# Patient Record
Sex: Male | Born: 1960 | Race: Black or African American | Hispanic: No | Marital: Married | Smoking: Never smoker
Health system: Southern US, Community
[De-identification: ages and names within clinical notes are randomized; demographics above are authoritative.]

## PROBLEM LIST (undated history)

## (undated) DIAGNOSIS — I10 Essential (primary) hypertension: Secondary | ICD-10-CM

## (undated) DIAGNOSIS — I509 Heart failure, unspecified: Secondary | ICD-10-CM

## (undated) DIAGNOSIS — Z6841 Body Mass Index (BMI) 40.0 and over, adult: Secondary | ICD-10-CM

## (undated) DIAGNOSIS — I2510 Atherosclerotic heart disease of native coronary artery without angina pectoris: Secondary | ICD-10-CM

## (undated) DIAGNOSIS — G250 Essential tremor: Secondary | ICD-10-CM

## (undated) DIAGNOSIS — G4733 Obstructive sleep apnea (adult) (pediatric): Secondary | ICD-10-CM

## (undated) DIAGNOSIS — E785 Hyperlipidemia, unspecified: Secondary | ICD-10-CM

## (undated) DIAGNOSIS — Z951 Presence of aortocoronary bypass graft: Secondary | ICD-10-CM

## (undated) DIAGNOSIS — I219 Acute myocardial infarction, unspecified: Secondary | ICD-10-CM

## (undated) DIAGNOSIS — R51 Headache: Secondary | ICD-10-CM

## (undated) DIAGNOSIS — F329 Major depressive disorder, single episode, unspecified: Secondary | ICD-10-CM

## (undated) DIAGNOSIS — F419 Anxiety disorder, unspecified: Secondary | ICD-10-CM

## (undated) HISTORY — DX: Obstructive sleep apnea (adult) (pediatric): G47.33

## (undated) HISTORY — DX: Anxiety disorder, unspecified: F41.9

## (undated) HISTORY — DX: Heart failure, unspecified: I50.9

## (undated) HISTORY — PX: OTHER SURGICAL HISTORY: SHX169

## (undated) HISTORY — DX: Headache: R51

## (undated) HISTORY — DX: Essential tremor: G25.0

## (undated) HISTORY — DX: Hyperlipidemia, unspecified: E78.5

## (undated) HISTORY — DX: Body Mass Index (BMI) 40.0 and over, adult: Z684

## (undated) HISTORY — DX: Morbid (severe) obesity due to excess calories: E66.01

## (undated) HISTORY — DX: Major depressive disorder, single episode, unspecified: F32.9

---

## 1999-10-22 ENCOUNTER — Emergency Department (HOSPITAL_COMMUNITY): Admission: EM | Admit: 1999-10-22 | Discharge: 1999-10-22 | Payer: Self-pay | Admitting: Emergency Medicine

## 2001-05-08 ENCOUNTER — Encounter: Payer: Self-pay | Admitting: Emergency Medicine

## 2001-05-08 ENCOUNTER — Inpatient Hospital Stay (HOSPITAL_COMMUNITY): Admission: EM | Admit: 2001-05-08 | Discharge: 2001-05-10 | Payer: Self-pay | Admitting: Emergency Medicine

## 2003-02-12 ENCOUNTER — Ambulatory Visit (HOSPITAL_COMMUNITY): Admission: RE | Admit: 2003-02-12 | Discharge: 2003-02-12 | Payer: Self-pay | Admitting: Gastroenterology

## 2005-09-13 ENCOUNTER — Emergency Department (HOSPITAL_COMMUNITY): Admission: EM | Admit: 2005-09-13 | Discharge: 2005-09-13 | Payer: Self-pay | Admitting: Family Medicine

## 2005-11-30 ENCOUNTER — Emergency Department (HOSPITAL_COMMUNITY): Admission: EM | Admit: 2005-11-30 | Discharge: 2005-11-30 | Payer: Self-pay | Admitting: Emergency Medicine

## 2005-12-01 ENCOUNTER — Ambulatory Visit (HOSPITAL_COMMUNITY): Admission: AD | Admit: 2005-12-01 | Discharge: 2005-12-01 | Payer: Self-pay | Admitting: Urology

## 2005-12-06 ENCOUNTER — Emergency Department (HOSPITAL_COMMUNITY): Admission: EM | Admit: 2005-12-06 | Discharge: 2005-12-06 | Payer: Self-pay | Admitting: Emergency Medicine

## 2006-06-29 HISTORY — PX: SHOULDER SURGERY: SHX246

## 2007-07-31 ENCOUNTER — Emergency Department (HOSPITAL_COMMUNITY): Admission: EM | Admit: 2007-07-31 | Discharge: 2007-07-31 | Payer: Self-pay | Admitting: Emergency Medicine

## 2010-11-14 NOTE — Op Note (Signed)
NAMESAVION, Trevor Jackson             ACCOUNT NO.:  [id number]   MEDICAL RECORD NO.:  [id number]          PATIENT TYPE:  AMB   LOCATION:  DAY                          FACILITY:  [facility]   PHYSICIAN:  Boston Service, M.D.DATE OF BIRTH:  07/12/1960   DATE OF PROCEDURE:  12/01/2005  DATE OF DISCHARGE:                                 OPERATIVE REPORT   PREOPERATIVE DIAGNOSIS:  6 x 6 mm right ureterovesical junction stone, 50-  year-old black male diabetic.   POSTOPERATIVE DIAGNOSIS:  6 x 6 mm right ureterovesical junction stone, 66-  year-old black male diabetic.   PROCEDURE:  Cystoscopy, retrograde ureteroscopy and stone manipulation.   SURGEON:  Boston Service, M.D.   ASSISTANT:  None.   ANESTHESIA:  General.   SPECIMEN:  6 x 6 mm stone.   ESTIMATED BLOOD LOSS:  Minimal.   COMPLICATIONS:  None.   DESCRIPTION OF PROCEDURE:  The patient was prepped and draped in the  dorsolithotomy position after institution of adequate level of general  anesthesia.  A well lubricated 21-French panendoscope was gently inserted at  the urethral meatus.  Normal urethra and sphincter, nonobstructive prostate.  Careful inspection of the bladder with the 12 and 70 degrees lenses showed  no obvious intravesical pathology.   Blocking catheter was selected, positioned at the left ureteral orifice.  Retrograde films were obtained with injection of 3-6 mL of contrast. Ureter,  pelvis and calyces were outlined without evidence of filling defect or  obstruction.  Similar technique was used on the right side. Tightly impacted  6 mm calculus noted within the distal ureter as a 6 mm filling defect with  proximal hydronephrosis.  No other obvious stones on the right side.  Guidewire was negotiated beyond the stone.  End-hole catheter passed over  the guide wire, left in place for about 5 minutes and then withdrawn.  There  was immediate reflux of concentrated urine from the right ureteral orifice.  The  short 6-French ureteroscope was then inserted alongside the guidewire.  Stone was encountered about 2 or 3 cm from the ureteral orifice.  Distal  ureter appeared well dilated at this point.  Stone was negotiated into a  Careers information officer and then gently withdrawn.  Stone was placed in a  sterile container.  Bard basket was removed.  Ureteroscope was reinserted to  the limit of the short 6-French scope.  There was no evidence of ureteral  injury.  There was mild erythema in the area where the stone had been  impacted, but no other obvious calculi within the ureter.  Ureteroscope was then withdrawn.  Cystoscope was reinserted.  Bladder was  drained.  The guidewire was removed on the right side.  The patient was  given a B&O suppository.  Cystoscope was then removed and the patient was  returned to recovery room in satisfactory condition.           ______________________________  Boston Service, M.D.     RH/MEDQ  D:  12/01/2005  T:  12/02/2005  Job:  [id number]   cc:   Olene Craven, M.D.  Fax:  [phone] 

## 2010-11-14 NOTE — Discharge Summary (Signed)
[facility]  Patient:    Trevor Jackson, Trevor Jackson Visit Number: [id number] MRN: [id number]          Service Type: MED Location: 3W [phone] Attending Physician:  Corwin Levins Dictated by:   Corwin Levins, M.D. [facility] Admit Date:  05/08/2001 Discharge Date: 05/10/2001   CC:         Evette Georges, M.D. [facility]   Discharge Summary  DISCHARGE DIAGNOSES: 1. Chest pain, probably musculoskeletal. 2. Diabetes mellitus. 3. Hypertension. 4. Morbid obesity.  CONSULTATIONS:  None.  PROCEDURE:  Two-dimensional echocardiogram revealing overall left ventricular systolic function normal.  Left ventricular ejection fraction estimated range 55-65%, no left ventricular regional wall motion abnormalities.  No evidence of pericarditis.  HISTORY OF PRESENT ILLNESS:  Please see History and Physical dictated on day of admission.  HOSPITAL COURSE:  Trevor Jackson is a very nice, 50 year old, African-American male with extremely strong family history of heart disease who presented with left upper chest discomfort.  It was pleuritic in nature, somewhat dull, but overall atypical with a differential at that point including pleurisy, pericarditis, myocardial ischemia, chest wall pain or GI.  He was admitted and telemetry applied.  Sinus rhythm was noted throughout.  Cardiac enzymes were negative.  A 2D echocardiogram as above.  Tylenol No. 3 was used for pain control which worked very well.  He was also at one point given Protonix 40 mg, but this did on effect the discomfort.  Chest x-ray was negative with no EKG signs noted.  Also of note was a hemoglobin A1C determined after elevated blood sugar on admission of 10.0 indicating at least mildly under control diabetes mellitus.  He was begun on Actos 30 mg prior to discharge which he tolerated well and this will be increased to 45 on discharge.  He was also started on Altace for elevated blood pressure to which he tolerated as well  and is to be increased to 5 mg to 10 mg on discharge.  DISPOSITION:  Discharged to home in good condition.  ACTIVITY:  No restrictions.  DIET:  Follow an 1800 calorie ADA diet.  FOLLOWUP:  Follow up with diabetic education coming up Monday with his first class.  He is to follow up with Dr. Tawanna Cooler in one to two weeks.  DISCHARGE MEDICATIONS: 1. Ecotrin 325 mg p.o. q.d. 2. Altace 10 mg p.o. q.d. 3. Actos 45 mg p.o. q.d. 4. Tylenol No. 3 one q.i.d. p.r.n., #30 with no refill. Dictated by:   Corwin Levins, M.D. [facility] Attending Physician:  Corwin Levins DD:  05/10/01 TD:  05/10/01 Job: [id number]

## 2010-11-14 NOTE — H&P (Signed)
[facility]  Patient:    Trevor Jackson, Trevor Jackson Visit Number: [id number] MRN: [id number]          Service Type: MED Location: 3W [phone] Attending Physician:  Evette Georges Dictated by:   Corwin Levins, M.D. [facility] Admit Date:  05/08/2001   CC:         Evette Georges, M.D. [facility]   History and Physical  CHIEF COMPLAINT:  Chest pain off and on since last p.m.  HISTORY OF PRESENT ILLNESS:  Trevor Jackson is a 50 year old black male with extremely strong family history of heart disease who is here with left chest discomfort off and on since last p.m., dull without radiation, shortness of breath, nausea, vomiting or diaphoresis.  It was questionably better with two sublingual nitroglycerins and O2 on entrance to the ER.  It is pleuritic, worse with deep breaths to the left chest but not with movement of the left shoulder.  Cardiac risk factors are negative for hypertension, diabetes and tobacco.  There is an unknown cholesterol status.  He does have a strong family history in early mid-40s of heart disease and MI with morbidity as well as morbid obesity.  He denies pain at the moment.  PAST MEDICAL HISTORY:  Illnesses:  Morbid obesity.  PAST SURGICAL HISTORY:  None.  ALLERGIES:  None.  MEDICATIONS:  None.  SOCIAL HISTORY:  Tobacco:  Never.  Alcohol:  None.  No illicit drugs. Married.  Two children.  Lives in [place] with the family.  FAMILY HISTORY:  Father deceased at 39 with heart attack.  Sister deceased at 76 with MI.  One brother deceased with colon and pancreas cancer at 84.  One sister at 43 with heart disease.  Another sister with MI, status post CABG at 18.  REVIEW OF SYSTEMS:  Noncontributory except for "feeling warm" after being here in the emergency room.  PHYSICAL EXAMINATION:  GENERAL:  Trevor Jackson is a very nice 50 year old black male, alert and oriented x 3.  VITAL SIGNS:  Blood pressure 147/105, pulse 81, respiratory rate  20, temperature 98.5.  EENT:   Sclerae clear.  TMs clear.  Pharynx benign.  NECK:  Without lymphadenopathy, JVD, thyromegaly.  CHEST:  No rales or wheezing.  CARDIAC:  Regular rate and rhythm.  There is no chest wall tenderness.  ABDOMEN:  Soft, nontender.  Positive bowel sounds.  No organomegaly.  No masses.  EXTREMITIES:  No edema.  NEUROLOGIC:  Cranial nerves II-XII intact, otherwise, nonfocal.  SKIN:  No rash or significant lesion.  LABORATORY DATA:  ECG:  Sinus rhythm with a questionable diffuse mild ST elevation versus early repolarization.  Chest x-ray pending.  CPK-MB ______ was 1.3.  Troponin I 0.02.  CMET normal except for sodium slightly low at 134, glucose 256.  LFTs otherwise within normal limits.  White blood cell count 6.7, hemoglobin 14.6.  ASSESSMENT AND PLAN: 1. Chest pain, atypical.  Differential includes pleurisy, pericarditis,    myocardial ischemia, chest wall or gastrointestinal.  He is to be admitted,    apply telemetry, rule out myocardial infarction with serial cardiac    enzymes, check 2-D echocardiogram, O2 at 2 L, pain medications, Protonix    40 mg, followup chest x-ray and consider outpatient stress testing should    he rule out. 2. Hyperglycemia, apparently new diagnosis.  Unclear whether this relates to    the low-grade temperature and acute mild illness such as viral.  Will check    hemoglobin A1c to  assess overall control the last several months.  Check    capillary blood glucoses and apply sliding-scale insulin. Dictated by:   Corwin Levins, M.D. [facility] Attending Physician:  Evette Georges DD:  05/08/01 TD:  05/09/01 Job: [id number]

## 2010-11-14 NOTE — Op Note (Signed)
Trevor Jackson, Trevor Jackson                         ACCOUNT NO.:  [id number]   MEDICAL RECORD NO.:  [id number]                   PATIENT TYPE:  AMB   LOCATION:  ENDO                                 FACILITY:  [facility]   PHYSICIAN:  Anselmo Rod, M.D.               DATE OF BIRTH:  02/18/1961   DATE OF PROCEDURE:  02/12/2003  DATE OF DISCHARGE:                                 OPERATIVE REPORT   PROCEDURE PERFORMED:  Screening colonoscopy.   ENDOSCOPIST:  Charna Elizabeth, M.D.   INSTRUMENT USED:  Olympus video colonoscope.   INDICATIONS FOR PROCEDURE:  The patient is a 50 year old African-American  male with a family history of colon cancer in his brother, who died of it at  84.  Rule out colonic polyps, masses, etc.   PREPROCEDURE PREPARATION:  Informed consent was procured from the patient.  The patient was fasted for eight hours prior to the procedure and prepped  with a bottle of magnesium citrate and a gallon of GoLYTELY the night prior  to the procedure.   PREPROCEDURE PHYSICAL:  The patient had stable vital signs.  Neck supple.  Chest clear to auscultation.  S1 and S2 regular.  Abdomen soft with normal  bowel sounds.   DESCRIPTION OF PROCEDURE:  The patient was placed in left lateral decubitus  position and sedated with 75 mg of Demerol and 7.5 mg of Versed  intravenously.  Once the patient was adequately sedated and maintained on  low flow oxygen and continuous cardiac monitoring, the Olympus video  colonoscope was advanced from the rectum to the cecum and terminal ileum  without difficulty.  The patient had a fairly good prep.  No masses, polyps,  erosions, ulcerations or diverticula were seen.  The appendicular orifice  and ileocecal valve were clearly visualized.  The terminal ileum appeared  normal and without lesions.  Retroflexion in the rectum revealed no  abnormalities.   IMPRESSION:  Normal colonoscopy up to the terminal ileum.                   RECOMMENDATIONS:  1. A high fiber diet with liberal fluid intake has been advocated.  2. Repeat colorectal cancer screening is recommended in the next five years     unless the patient develops any abnormal symptoms in the interim.  3. Outpatient follow-up as need arises in the future.                                                   Anselmo Rod, M.D.    JNM/MEDQ  D:  02/12/2003  T:  02/12/2003  Job:  [id number]   cc:   Olene Craven, M.D.  [address]  [address]  [place]  [place] [address]  Fax:  [phone]  

## 2011-01-07 ENCOUNTER — Inpatient Hospital Stay (HOSPITAL_COMMUNITY): Payer: Worker's Compensation

## 2011-01-07 ENCOUNTER — Inpatient Hospital Stay (HOSPITAL_COMMUNITY)
Admission: AD | Admit: 2011-01-07 | Discharge: 2011-01-10 | DRG: 247 | Disposition: A | Discharge status: Home / Self Care | Payer: Worker's Compensation | Source: Other Acute Inpatient Hospital | Attending: Cardiovascular Disease | Admitting: Cardiovascular Disease

## 2011-01-07 DIAGNOSIS — E119 Type 2 diabetes mellitus without complications: Secondary | ICD-10-CM | POA: Diagnosis present

## 2011-01-07 DIAGNOSIS — E663 Overweight: Secondary | ICD-10-CM | POA: Diagnosis present

## 2011-01-07 DIAGNOSIS — E785 Hyperlipidemia, unspecified: Secondary | ICD-10-CM | POA: Diagnosis present

## 2011-01-07 DIAGNOSIS — I219 Acute myocardial infarction, unspecified: Secondary | ICD-10-CM

## 2011-01-07 DIAGNOSIS — Z8249 Family history of ischemic heart disease and other diseases of the circulatory system: Secondary | ICD-10-CM

## 2011-01-07 DIAGNOSIS — I2119 ST elevation (STEMI) myocardial infarction involving other coronary artery of inferior wall: Principal | ICD-10-CM | POA: Diagnosis present

## 2011-01-07 DIAGNOSIS — Z79899 Other long term (current) drug therapy: Secondary | ICD-10-CM

## 2011-01-07 DIAGNOSIS — Z23 Encounter for immunization: Secondary | ICD-10-CM

## 2011-01-07 DIAGNOSIS — I2510 Atherosclerotic heart disease of native coronary artery without angina pectoris: Secondary | ICD-10-CM | POA: Diagnosis present

## 2011-01-07 DIAGNOSIS — I10 Essential (primary) hypertension: Secondary | ICD-10-CM | POA: Diagnosis present

## 2011-01-07 HISTORY — DX: Acute myocardial infarction, unspecified: I21.9

## 2011-01-07 HISTORY — PX: CORONARY ANGIOPLASTY WITH STENT PLACEMENT: SHX49

## 2011-01-07 LAB — POCT I-STAT, CHEM 8
BUN: 17 mg/dL (ref 6–23)
Calcium, Ion: 1.26 mmol/L (ref 1.12–1.32)
Chloride: 103 mEq/L (ref 96–112)
Creatinine, Ser: 0.7 mg/dL (ref 0.50–1.35)
Glucose, Bld: 391 mg/dL — ABNORMAL HIGH (ref 70–99)
HCT: 52 % (ref 39.0–52.0)
Hemoglobin: 17.7 g/dL — ABNORMAL HIGH (ref 13.0–17.0)
Potassium: 3.6 mEq/L (ref 3.5–5.1)
Sodium: 136 mEq/L (ref 135–145)
TCO2: 21 mmol/L (ref 0–100)

## 2011-01-07 LAB — CBC
Hemoglobin: 15.6 g/dL (ref 13.0–17.0)
MCHC: 34.5 g/dL (ref 30.0–36.0)
RDW: 12.8 % (ref 11.5–15.5)
WBC: 13.3 10*3/uL — ABNORMAL HIGH (ref 4.0–10.5)

## 2011-01-07 LAB — CARDIAC PANEL(CRET KIN+CKTOT+MB+TROPI)
CK, MB: 2.8 ng/mL (ref 0.3–4.0)
Relative Index: 2.1 (ref 0.0–2.5)
Relative Index: 4.1 — ABNORMAL HIGH (ref 0.0–2.5)
Total CK: 134 U/L (ref 7–232)
Total CK: 922 U/L — ABNORMAL HIGH (ref 7–232)
Troponin I: <0.3 ng/mL (ref <0.30)
Troponin I: >25 ng/mL (ref <0.30)

## 2011-01-07 LAB — PROTIME-INR
INR: 0.93 (ref 0.00–1.49)
Prothrombin Time: 12.7 seconds (ref 11.6–15.2)

## 2011-01-07 LAB — COMPREHENSIVE METABOLIC PANEL
ALT: 23 U/L (ref 0–53)
AST: 19 U/L (ref 0–37)
Alkaline Phosphatase: 63 U/L (ref 39–117)
CO2: 21 mEq/L (ref 19–32)
Calcium: 10.2 mg/dL (ref 8.4–10.5)
GFR calc Af Amer: >60 mL/min (ref >60)
GFR calc non Af Amer: >60 mL/min (ref >60)
Glucose, Bld: 370 mg/dL — ABNORMAL HIGH (ref 70–99)
Total Bilirubin: 1 mg/dL (ref 0.3–1.2)

## 2011-01-07 LAB — LIPID PANEL: LDL Cholesterol: 204 mg/dL — ABNORMAL HIGH (ref 0–99)

## 2011-01-07 LAB — HEMOGLOBIN A1C: Mean Plasma Glucose: 298 mg/dL — ABNORMAL HIGH (ref <117)

## 2011-01-07 LAB — APTT: aPTT: 25 seconds (ref 24–37)

## 2011-01-07 LAB — PLATELET COUNT: Platelets: 244 10*3/uL (ref 150–400)

## 2011-01-07 LAB — GLUCOSE, CAPILLARY: Glucose-Capillary: 317 mg/dL — ABNORMAL HIGH (ref 70–99)

## 2011-01-08 LAB — CBC
HCT: 39.8 % (ref 39.0–52.0)
Hemoglobin: 14.1 g/dL (ref 13.0–17.0)
MCH: 29.4 pg (ref 26.0–34.0)
MCHC: 35.4 g/dL (ref 30.0–36.0)
RDW: 12.8 % (ref 11.5–15.5)

## 2011-01-08 LAB — BASIC METABOLIC PANEL
CO2: 25 mEq/L (ref 19–32)
Calcium: 9.5 mg/dL (ref 8.4–10.5)
Chloride: 96 mEq/L (ref 96–112)
Glucose, Bld: 239 mg/dL — ABNORMAL HIGH (ref 70–99)
Sodium: 134 mEq/L — ABNORMAL LOW (ref 135–145)

## 2011-01-08 LAB — GLUCOSE, CAPILLARY
Glucose-Capillary: 250 mg/dL — ABNORMAL HIGH (ref 70–99)
Glucose-Capillary: 239 mg/dL — ABNORMAL HIGH (ref 70–99)
Glucose-Capillary: 247 mg/dL — ABNORMAL HIGH (ref 70–99)

## 2011-01-08 LAB — CARDIAC PANEL(CRET KIN+CKTOT+MB+TROPI)
Relative Index: 3.7 — ABNORMAL HIGH (ref 0.0–2.5)
Total CK: 841 U/L — ABNORMAL HIGH (ref 7–232)

## 2011-01-08 LAB — TSH: TSH: 0.533 u[IU]/mL (ref 0.350–4.500)

## 2011-01-09 LAB — CARDIAC PANEL(CRET KIN+CKTOT+MB+TROPI)
CK, MB: 7.9 ng/mL (ref 0.3–4.0)
Relative Index: 2.1 (ref 0.0–2.5)
Troponin I: 8.51 ng/mL (ref <0.30)

## 2011-01-09 LAB — CBC
MCHC: 34.2 g/dL (ref 30.0–36.0)
Platelets: 205 10*3/uL (ref 150–400)
RDW: 12.8 % (ref 11.5–15.5)
WBC: 11 10*3/uL — ABNORMAL HIGH (ref 4.0–10.5)

## 2011-01-09 LAB — BASIC METABOLIC PANEL
CO2: 28 mEq/L (ref 19–32)
Glucose, Bld: 175 mg/dL — ABNORMAL HIGH (ref 70–99)
Potassium: 3.7 mEq/L (ref 3.5–5.1)
Sodium: 134 mEq/L — ABNORMAL LOW (ref 135–145)

## 2011-01-09 LAB — GLUCOSE, CAPILLARY
Glucose-Capillary: 203 mg/dL — ABNORMAL HIGH (ref 70–99)
Glucose-Capillary: 240 mg/dL — ABNORMAL HIGH (ref 70–99)

## 2011-01-10 LAB — GLUCOSE, CAPILLARY: Glucose-Capillary: 170 mg/dL — ABNORMAL HIGH (ref 70–99)

## 2011-01-21 NOTE — Cardiovascular Report (Signed)
NAMEZERIC, BARANOWSKI NO.:  [id number]  MEDICAL RECORD NO.:  [id number]  LOCATION:  2922                         FACILITY:  [facility]  PHYSICIAN:  Nanetta Batty, M.D.   DATE OF BIRTH:  1960-12-25  DATE OF PROCEDURE: DATE OF DISCHARGE:                           CARDIAC CATHETERIZATION   Mr. Bonelli is a 50 year old mildly overweight married African American male, positive risk factors including hypertension, diabetes and hyperlipidemia, but no prior history of heart disease.  He is working at [employer] today and developed chest pain 50 minutes after leaving.  He called EMS, was brought to [facility].  Initial EKG showed inferior ST-segment elevation.  He was brought to the cath lab for urgent angiography and intervention.  DESCRIPTION OF PROCEDURE:  The patient was brought to the Second [facility] Cardiac Cath lab urgently in the postabsorptive state.  He was not premedicated.  His right groin was prepped and shaved in usual sterile fashion.  Xylocaine 1% was used for local anesthesia.  A 6- French sheath was inserted into the right femoral artery using standard Seldinger technique.  A 6-French right and left Judkins diagnostic catheter as well as 6-French pigtail catheter were used for selective coronary angiography and left ventriculography respectively.  Visipaque dye was used for the entirety of the case.  Retrograde aorta, left ventricular blood pressures were recorded.  HEMODYNAMIC RESULTS: 1. Aortic systolic pressure 171, diastolic pressure 83. 2. Left ventricular systolic pressure 171, end-diastolic pressure 15.  ANGIOGRAPHIC RESULTS: 1. Left main normal. 2. LAD; left main had sequential 50% lesions in the first third     followed by a 90% focal lesion in the midportion of the takeoff of     diagonal branch. 3. Left circumflex; left circumflex had a 90% stenosis in the mid AV     groove just prior to bifurcation with a large marginal branch.   The     marginal branch had 50% segmental proximal stenosis. 4. Right coronary artery; this is a dominant vessel infarct-related     artery.  There is a ruptured plaque on the proximal bend which was     90-95% and hypodense, obviously thrombotic.  There was 50-60%     stenosis segmental at the genu.  The PDA was almost flush occluded     and a continuation of PLA had a 60% ostial stenosis. 5. Left ventriculography; RAO left ventriculogram was performed using     25 mL of Visipaque dye at 12 mL per second.  The overall LVEF     estimated at 50-55% with moderate distal inferoapical hypokinesia.  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  His PDA probably related to a ruptured plaque in the proximal right coronary artery.  Options include coronary artery bypass grafting given his age and a focal nature of disease.  We will proceed with PDA through establish flow and stenting of his proximal right with staged intervention of his LAD and circumflex lesions.  DESCRIPTION OF PROCEDURE:  The patient received a baby aspirin by EMS, Effient 60 mg p.o. load, Angiomax bolus with an ACT of 375.  Integrilin double bolus infusion  begun at the end of the case because of thrombus burden.  A total of 190 mL of contrast was used during the case.  A total balloon time was measured at 20 minutes.  Using a 6-French JR-4 guide catheter along with an 0.014 x 190 Asahi soft wire and 2.0 x 12 Emerge angioplasty balloon, PTCA was performed in the PDA reestablishing flow.  This was diffusely diseased vessel and angioplasty was performed up and down the entirety of the vessel.  This was obviously thrombin "dye hang-up."  The balloon was then upgraded to a 2.5 x 20 Emerge which was used to treat the proximal PDA, 200 mcg of intracoronary nitroglycerin was given twice during the case.  There was re-establishment of brisk flow in the PDA, although the apical  PDA appeared to be occluded with thrombus.  Following this, direct stenting of the proximal right was performed with a 3.0 x 22 Resolute drug- eluting stent at 12 atmospheres and postdilated with a 3.25 x 20 Blue Ridge Quantum Maverick at 16 atmospheres (3.34 mm) resulting reduction of a proximal 90% hypodense thrombotic-appearing lesion to 0% residual with excellent flow.  The patient tolerated the procedure well without hemodynamic or electrocardiographic sequelae.  The guidewire and catheter were removed and the sheath was sewn securely in place. Angiomax was discontinued.  The sheath will be removed in several hours. The patient will receive Integrilin infusion for 18 hours.  He will be treated with aspirin, Effient, beta-blocker, statin and ACE inhibitor. He will be fast tracked to a step-down and will ultimately require staged circumflex and LAD intervention during this admission.  He left the lab in stable condition.     Nanetta Batty, M.D.     JB/MEDQ  D:  01/07/2011  T:  01/08/2011  Job:  [id number]  cc:   [facility]  Electronically Signed by Nanetta Batty M.D. on 01/21/2011 03:20:04 PM

## 2011-01-21 NOTE — H&P (Signed)
NAMECAIDON, FOTI NO.:  [id number]  MEDICAL RECORD NO.:  [id number]  LOCATION:  2922                         FACILITY:  [facility]  PHYSICIAN:  Nanetta Batty, M.D.   DATE OF BIRTH:  [birth year]  DATE OF ADMISSION:  01/07/2011 DATE OF DISCHARGE:                             HISTORY & PHYSICAL   CHIEF COMPLAINT:  Chest pain.  HISTORY OF PRESENT ILLNESS:  Mr. Jewel is a 50 year old male with no prior history of coronary disease, who presented to the hospital today as a STEMI.  The patient was working out in a gym and had finished.  He developed pain across his chest.  He took an aspirin and went to take a shower.  When he got out the shower, he broke out in cold sweat.  He called 911.  EMS EKG revealed inferior ST elevation.  The patient was taken the cath lab urgently by Dr. Allyson Sabal and had a 95% proximal RCA stenosis that was intervened on with drug-eluting stent.  He does have some residual disease and will need a staged intervention.  He tolerated the procedure well.  Please see Dr. Hazle Coca note for complete details. The patient denies any radiation of his chest pain to his neck or arms and had no associated nausea or vomiting.  PAST MEDICAL HISTORY:  Remarkable for; 1. Type 2 non-insulin-dependent diabetes. 2. He has treated hypertension. 3. Treated dyslipidemia.  PAST SURGICAL HISTORY:  Previous surgeries include; 1. Right rotator cuff repair. 2. Past surgery for nephrolithiasis.  HOME MEDICATIONS: 1. Metformin 1 g b.i.d. 2. Rosuvastatin 40 mg a day. 3. Lisinopril 20 mg a day. 4. Glyburide 5 mg b.i.d. 5. Niacin 500 mg at bedtime.  ALLERGIES:  He has no known drug allergies.  SOCIAL HISTORY:  He is married.  He has 2 children and 2 grandchildren. He is nonsmoker.  He works as an Merchant navy officer.  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  He gets his care from the [facility] in  [place].  FAMILY HISTORY:  Remarkable for early coronary disease, his mother had an MI at 61, and sister had an MI at 55.  REVIEW OF SYSTEMS:  Essentially unremarkable except for noted above.  He denies any GI bleeding or melena.  He has not had peripheral neuropathy, nephropathy, or retinopathy from his diabetes.  He has gone on a diet and started exercising and lost 40 pounds or so over the past 6 months.  PHYSICAL EXAMINATION:  VITAL SIGNS:  Blood pressure 135/88, pulse 87, and O2 sat is 96%. GENERAL:  He is a well-developed overweight African American male, in no acute distress.  He is 5.5 feet and weighs 237. HEENT:  Normocephalic and atraumatic.  Extraocular movements intact. Sclera is nonicteric. NECK:  Without JVD or bruit. CHEST:  Clear to auscultation or percussion. CARDIAC:  Regular rate and rhythm without murmur, rub, or gallop. Normal S1 and S2. ABDOMEN:  Nontender and nondistended. EXTREMITIES:  Without edema.  He has had dressing on his right groin. NEUROLOGIC:  Grossly intact.  He is awake, alert, oriented, and co- operative.  Moves all  extremities without obvious deficit. SKIN:  Cool and dry.  LABORATORY DATA:  EKG as noted shows inferior ST elevation consistent with inferior ST elevation MI.  White count 13.3, hemoglobin 15.6, hematocrit 45.2, platelet 233, and INR 0.93.  Capillary glucose 317. Sodium 134, potassium 3.6, BUN 17, and creatinine 0.7.  Liver functions are normal.  Cholesterol is 324, triglycerides 341, HDL 52, and LDL 204.  IMPRESSION: 1. Inferior ST elevation myocardial infarction, treated with right     coronary artery drug-eluting stent. 2. Residual coronary disease in the left anterior descending and     circumflex. 3. Preserved left ventricular function. 4. Type 2 non-insulin dependent diabetes. 5. Treated dyslipidemia with inadequate control. 6. Treated hypertension. 7. Strong family history of coronary artery disease.  PLAN:  The  patient is admitted to the CCU.  He will need staged intervention at some point.  We will go ahead and document his TSH and optimize his medical therapy.     Abelino Derrick, P.A.   ______________________________ Nanetta Batty, M.D.    Lenard Lance  D:  01/07/2011  T:  01/07/2011  Job:  [id number]  Electronically Signed by Corine Shelter P.A. on 01/13/2011 12:10:36 PM Electronically Signed by Nanetta Batty M.D. on 01/21/2011 03:20:01 PM

## 2011-01-22 NOTE — Discharge Summary (Signed)
NAMEQUILL, GRINDER NO.:  [id number]  MEDICAL RECORD NO.:  [id number]  LOCATION:  3743                         FACILITY:  [facility]  PHYSICIAN:  Italy Hilty, MD         DATE OF BIRTH:  [birth year]  DATE OF ADMISSION:  01/07/2011 DATE OF DISCHARGE:  01/10/2011                              DISCHARGE SUMMARY   DISCHARGE DIAGNOSES: 1. ST-elevation myocardial infarction status post percutaneous     coronary intervention to the right coronary artery. 2. Hypertension. 3. Diabetes mellitus type 2. 4. Hyperlipidemia.  HOSPITAL COURSE:  Mr. Stgermaine is a 50 year old male with a prior history of coronary artery disease presented to the hospital with ST-elevation myocardial infarction.  The patient stated he was working out in [employer] and has finished.  He developed chest pain across the chest, he took an aspirin with the shower.  He took shower, he broke out in cold sweat and called 911.  EKG revealed ST elevation in the inferior leads.  The patient was taken directly to the cath lab urgently.  Dr. Allyson Sabal stented 95% proximal RCA stenosis with a drug-eluting stent.  The patient was admitted subsequently to the CCU.  Cardiac cath revealed a normal left main LAD with a 50% lesion to first third, followed by 90% focal lesion in the midportion with takeoff of diagonal branch.  Left circumflex had a 90% stenosis in the mid AV groove, prior to bifurcation was a large marginal branch.  Marginal branch had 50% segmental proximal stenosis. Overall LVEF was estimated to 50-55% with moderate distal inferior apical hypokinesis.  The patient did well.  No acute concerns afterwards.  His Glucophage was held.  The patient's hemoglobin A1c was 12%.  Diabetes education consult was requested.  The patient continued without chest pain or shortness of breath.  He has been started on Effient, lisinopril, metoprolol and Plavix as well as continued on his Crestor.  He will follow up outpatient  with Dr. Allyson Sabal in a few weeks. The patient will need either staged intervention or consideration for coronary artery bypass grafting due to the serious multivessel nature of this disease.  Cardiac rehab was also initiated and they were continued as an outpatient.  DISCHARGE LABORATORY FINDINGS:  WBC is 11.0, hemoglobin is 13.8, hematocrit 38.6, platelets 205.  Sodium 134, potassium 3.7, chloride 97, carbon dioxide 28, BUN 15, creatinine 0.74, calcium 9.2, troponin is greater than 25, total cholesterol was 324, triglycerides 341, HDL is 52, LDL 204, VLDL 68, and total cholesterol HDL ratio was 6.2.  Thyroid stimulating hormone is 0.533.  MRSA negative.  Hemoglobin A1c is 12.0. Mean plasma glucose of 298.  STUDIES/PROCEDURES: 1. Chest x-ray showed no acute cardiopulmonary process. 2. Cardiac catheterization on January 07, 2011.  Angiographic results     showed normal left main, LAD has 50% lesion first third followed by     90% focal lesion on the mid portion of the takeoff of the diagonal     branch.  Left circumflex has 90% in the mid AV groove, prior to     bifurcation was a large marginal branch.  The marginal branch had     50% segmental  proximal stenosis.  Right coronary artery was     dominant vessel, infarct-related artery.  There is a ruptured     plaque on the proximal bend which was 90-95% and hypodense.     Obviously thrombotic.  There was 50-60% stenosis taken off from the     genu.  The PDA was almost flushed, occluded and continuation of the     PLA had 60% ostial stenosis.  Left ventriculography showed an     estimated ejection fraction of 50-55% with moderate distal inferior     hypokinesia.  The patient received PCI to the proximal RCA and PDA     with a drug-eluting stent.  DISCHARGE MEDICATIONS: 1. Aspirin 325 mg 1 tablet by mouth daily. 2. Lisinopril 20 mg 1 tablet by mouth daily. 3. Metoprolol 25 mg 1 tablet by mouth twice daily. 4. Nitroglycerin sublingual 0.4 mg  1 tablet under the tongue every 5     minutes up to 3 doses total for chest pain. 5. Protonix 40 mg 1 tablet by mouth daily. 6. Prasugrel 10 mg 1 tablet by mouth daily. 7. Cinnamon over-the-counter 1 tablet by mouth daily. 8. Glyburide 5 mg 1 tablet by mouth daily. 9. Metformin 1000 mg 1 tablet by mouth twice daily. 10.Multivitamin over-the-counter 1 tablet by mouth daily. 11.Rosuvastatin 40 mg 1 tablet by mouth daily. 12.Slo-Niacin 500 mg 1 tablet by mouth daily. 13.Vitamin C over-the-counter 1 tablet by mouth daily. 14.Vitamin D over-the-counter 1 tablet by mouth daily. 15.Vitamin E over-the-counter 1 tablet by mouth daily.  DISPOSITION:  Mr. Sievers discharged home in stable condition.  He was recommended to increase his activity slowly.  He was recommended to eat a heart-healthy, low-carbohydrate diet.  He will follow up with Dr. Allyson Sabal in few weeks and the office will call with the appointment time.    ______________________________ Wilburt Finlay, PA   ______________________________ [place] Hilty, MD    BH/MEDQ  D:  01/10/2011  T:  01/11/2011  Job:  [id number]  Electronically Signed by Wilburt Finlay PA on 01/14/2011 12:01:11 PM Electronically Signed by Kirtland Bouchard. HILTY M.D. on 01/22/2011 06:36:34 PM

## 2011-03-05 ENCOUNTER — Ambulatory Visit (HOSPITAL_COMMUNITY)
Admission: RE | Admit: 2011-03-05 | Discharge: 2011-03-05 | Disposition: A | Discharge status: Home / Self Care | Payer: BC Managed Care – PPO | Source: Ambulatory Visit | Attending: Cardiovascular Disease | Admitting: Cardiovascular Disease

## 2011-03-05 DIAGNOSIS — I252 Old myocardial infarction: Secondary | ICD-10-CM | POA: Insufficient documentation

## 2011-03-05 DIAGNOSIS — I2510 Atherosclerotic heart disease of native coronary artery without angina pectoris: Secondary | ICD-10-CM | POA: Insufficient documentation

## 2011-03-05 DIAGNOSIS — I10 Essential (primary) hypertension: Secondary | ICD-10-CM | POA: Insufficient documentation

## 2011-03-05 LAB — GLUCOSE, CAPILLARY: Glucose-Capillary: 129 mg/dL — ABNORMAL HIGH (ref 70–99)

## 2011-03-20 LAB — POCT RAPID STREP A: Streptococcus, Group A Screen (Direct): NEGATIVE

## 2011-03-29 NOTE — Cardiovascular Report (Signed)
NAME:  Trevor Jackson, Trevor Jackson NO.:  [id number]  MEDICAL RECORD NO.:  [id number]  LOCATION:  [facility]                         FACILITY:  [facility]  PHYSICIAN:  Nanetta Batty, M.D.   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  His risks factors include hypertension, hyperlipidemia, and non-insulin requiring diabetes as well as family history which is fairly strong.  He suffered a ST-segment elevation myocardial infarction on January 07, 2011, secondary to the occluded PDA with high-grade proximal dominant RCA stenosis. Angioplasty of the PDA __________ proximal right.  He does have residual disease in his AV groove circumflex, obtuse marginal branch, proximal mid and distal LAD with an EF of 50-55% and moderate inferobasal hypokinesia.  He has had a couple of episodes of chest pain.  Recent Myoview showed inferior scar, mild to moderate ischemia.  He presents now for diagnostic coronary arteriography to relook its anatomy and determine whether or not he is a candidate for percutaneous intervention, coronary bypass grafting or continued medical therapy.  DESCRIPTION OF PROCEDURE:  The patient brought to the second floor [facility] in the postabsorptive state.  He was premedicated with p.o. Valium.  His right groin was prepped and shaved in the usual sterile fashion.  1% Xylocaine was used for local anesthesia.  A 5- French sheath was inserted into the right femoral artery using standard Seldinger technique.  A 5-French right and left Judkins diagnostic catheters were used for selective cholangiography, left ventriculography was  not performed.  Retrograde pressures monitored during the case.  ANGIOGRAPHIC RESULTS: 1. Left main normal. 2. LAD; LAD had 40-50% proximal mid and mid-to-distal stenosis.  There     was a fairly focal 80% stenosis just after the second diagonal     branch. 3. Circumflex; 70% tubular stenosis in the proximal AV groove     straddling the first moderate-sized obtuse marginal branch.  The     obtuse marginal branch had a 40% segmental proximal stenosis. 4. Right coronary; 40-50% proximal stenosis.  __________ patent.     There was a 40% hypodense lesion in the mid vessel, 40% at the     genu, and 70% of the origin in the PLA branch.  The previously     instrumented PDA had diffuse 60-70% stenosis, but was widely patent     with TIMI3 flow.  IMPRESSION:  Trevor Jackson has moderate three-vessel disease with inferior scar and moderate superimposed ischemia.  At this point, my inclination is to treat him medically since his vessel is somewhat improved compared to his initial angiogram.  If he has recurrent pain, he will be candidate for angioplasty of his mid LAD right after the second diagonal branch as well as AV groove circumflex.  The sheath was removed and a 5-French Exoseal device was used to obtain hemostasis.  The patient left lab in stable condition.  He will remain recumbent for 2 hours and will be discharged home.  I will see him back in the office in 1-2 weeks for follow up.     Nanetta Batty, M.D.     [name]  D:  03/05/2011  T:  03/06/2011  Job:  [id number]  cc:   Second Floor [facility] Trevor Rasmussen, NP  Electronically Signed by Nanetta Batty M.D. on 03/29/2011 11:45:18 AM

## 2011-08-31 ENCOUNTER — Emergency Department (HOSPITAL_COMMUNITY)
Admission: EM | Admit: 2011-08-31 | Discharge: 2011-08-31 | Disposition: A | Discharge status: Home Health | Payer: BC Managed Care – PPO | Source: Home / Self Care

## 2011-08-31 ENCOUNTER — Encounter (HOSPITAL_COMMUNITY): Payer: Self-pay

## 2011-08-31 DIAGNOSIS — G510 Bell's palsy: Secondary | ICD-10-CM

## 2011-08-31 HISTORY — DX: Acute myocardial infarction, unspecified: I21.9

## 2011-08-31 HISTORY — DX: Atherosclerotic heart disease of native coronary artery without angina pectoris: I25.10

## 2011-08-31 HISTORY — DX: Essential (primary) hypertension: I10

## 2011-08-31 MED ORDER — PREDNISONE 20 MG PO TABS: 20.0000 mg | ORAL_TABLET | Freq: Three times a day (TID) | ORAL | Status: AC

## 2011-08-31 NOTE — ED Notes (Signed)
C/o swelling to rt side of face, rt eye weakness and tearing and states smile is abnormal.  Sx started this am.  Denies weakness, pain or trouble with speech or vision.

## 2011-08-31 NOTE — ED Provider Notes (Signed)
History     CSN: [id number]  Arrival date & time 08/31/11  1332   None     Chief Complaint  Patient presents with  . Facial Swelling  . Numbness    (Consider location/radiation/quality/duration/timing/severity/associated sxs/prior treatment) HPI Comments: Trevor Jackson presents today with complaints of drooping of the right side of his face. He states that he has had tearing from his right eye and the right side of his tongue has altered taste sensation for 3 days. When he awoke this morning and looked in the near he noticed that his smile and right eye looked different. He denies headache, dizziness, visual changes or paresthesias. He has a history of diabetes which he states has been well controlled. His fasting sugars run from 114-120 when he checks them at home daily.   Past Medical History  Diagnosis Date  . Coronary artery disease   . Hypertension   . Diabetes mellitus   . Myocardial infarction     Past Surgical History  Procedure Date  . Shoulder surgery   . Coronary angioplasty with stent placement     History reviewed. No pertinent family history.  History  Substance Use Topics  . Smoking status: Never Smoker   . Smokeless tobacco: Not on file  . Alcohol Use: No      Review of Systems  Constitutional: Negative for fever and chills.  HENT: Negative for ear pain, congestion, sore throat, neck pain and sinus pressure.   Eyes: Negative for pain, redness and visual disturbance.  Respiratory: Negative for shortness of breath.   Cardiovascular: Negative for chest pain.  Gastrointestinal: Negative for nausea and vomiting.  Neurological: Positive for facial asymmetry. Negative for dizziness, speech difficulty, weakness, numbness and headaches.    Allergies  Review of patient's allergies indicates no known allergies.  Home Medications   Current Outpatient Rx  Name Route Sig Dispense Refill  . ASPIRIN 81 MG PO TABS Oral Take 81 mg by mouth daily.    . ISOSORBIDE  MONONITRATE ER 30 MG PO TB24 Oral Take 30 mg by mouth daily. 1/2 tablet    . LINAGLIPTIN-METFORMIN HCL 2.5-500 MG PO TABS Oral Take by mouth 2 (two) times daily.    Marland Kitchen LISINOPRIL 20 MG PO TABS Oral Take 20 mg by mouth daily.    Marland Kitchen METOPROLOL TARTRATE 25 MG PO TABS Oral Take 25 mg by mouth 2 (two) times daily. 1 1/2 tablets    . NIACIN ER (ANTIHYPERLIPIDEMIC) 500 MG PO TBCR Oral Take 1,000 mg by mouth at bedtime.    Marland Kitchen NITROGLYCERIN SL Sublingual Place under the tongue as needed.    Marland Kitchen PRASUGREL HCL 10 MG PO TABS Oral Take 10 mg by mouth daily.    Marland Kitchen ROSUVASTATIN CALCIUM 40 MG PO TABS Oral Take 40 mg by mouth daily.    Marland Kitchen PREDNISONE 20 MG PO TABS Oral Take 1 tablet (20 mg total) by mouth 3 (three) times daily. 21 tablet 0    BP 125/72  Pulse 70  Temp(Src) 98.9 F (37.2 C) (Oral)  Resp 16  SpO2 98%  Physical Exam  Nursing note and vitals reviewed. Constitutional: He appears well-developed and well-nourished. No distress.  HENT:  Head: Atraumatic.  Right Ear: Tympanic membrane, external ear and ear canal normal.  Left Ear: Tympanic membrane, external ear and ear canal normal.  Nose: Nose normal.  Mouth/Throat: Uvula is midline, oropharynx is clear and moist and mucous membranes are normal. No oropharyngeal exudate, posterior oropharyngeal edema or posterior oropharyngeal erythema.  Rt upper lid lag noted with blinking. Pt able to fully close eyelids bilat. Mild drooping of Rt corner of mouth. Full smile. Also mild weakness of Rt forehead compared to Lt. Tongue extension straight.  Eyes: Conjunctivae and EOM are normal. Pupils are equal, round, and reactive to light.  Fundoscopic exam:      The right eye shows no hemorrhage and no papilledema.       The left eye shows no hemorrhage and no papilledema.  Neck: Neck supple.  Cardiovascular: Normal rate, regular rhythm and normal heart sounds.   Pulmonary/Chest: Effort normal and breath sounds normal. No respiratory distress.    Lymphadenopathy:    He has no cervical adenopathy.  Neurological: He is alert. He has normal strength. No cranial nerve deficit. Gait normal.  Reflex Scores:      Bicep reflexes are 2+ on the right side and 2+ on the left side. Skin: Skin is warm and dry.  Psychiatric: He has a normal mood and affect.    ED Course  Procedures (including critical care time)  Labs Reviewed - No data to display No results found.   1. Bell's palsy       MDM  Hx and exam findings consistent with Bell's Palsy.        Melody Comas, [place] 08/31/11 1601

## 2011-08-31 NOTE — Discharge Instructions (Signed)
Prednisone will increase your blood sugar. Continue to monitor daily and follow up with your primary care dr next week. Use artificial tears eye drops as needed if your eye begins to become dry.   Bell's Palsy Bell's palsy is a condition in which the muscles on one side of the face cannot move (paralysis). This is because the nerves in the face are paralyzed. It is most often thought to be caused by a virus. The virus causes swelling of the nerve that controls movement on one side of the face. The nerve travels through a tight space surrounded by bone. When the nerve swells, it can be compressed by the bone. This results in damage to the protective covering around the nerve. This damage interferes with how the nerve communicates with the muscles of the face. As a result, it can cause weakness or paralysis of the facial muscles.  Injury (trauma), tumor, and surgery may cause Bell's palsy, but most of the time the cause is unknown. It is a relatively common condition. It starts suddenly (abrupt onset) with the paralysis usually ending within 2 days. Bell's palsy is not dangerous. But because the eye does not close properly, you may need care to keep the eye from getting dry. This can include splinting (to keep the eye shut) or moistening with artificial tears. Bell's palsy very seldom occurs on both sides of the face at the same time. SYMPTOMS   Eyebrow sagging.   Drooping of the eyelid and corner of the mouth.   Inability to close one eye.   Loss of taste on the front of the tongue.   Sensitivity to loud noises.  TREATMENT  The treatment is usually non-surgical. If the patient is seen within the first 24 to 48 hours, a short course of steroids may be prescribed, in an attempt to shorten the length of the condition. Antiviral medicines may also be used with the steroids, but it is unclear if they are helpful.  You will need to protect your eye, if you cannot close it. The cornea (clear covering  over your eye) will become dry and can be damaged. Artificial tears can be used to keep your eye moist. Glasses or an eye patch should be worn to protect your eye. PROGNOSIS  Recovery is variable, ranging from days to months. Although the problem usually goes away completely (about 80% of cases resolve), predicting the outcome is impossible. Most people improve within 3 weeks of when the symptoms began. Improvement may continue for 3 to 6 months. A small number of people have moderate to severe weakness that is permanent.  HOME CARE INSTRUCTIONS   If your caregiver prescribed medication to reduce swelling in the nerve, use as directed. Do not stop taking the medication unless directed by your caregiver.   Use moisturizing eye drops as needed to prevent drying of your eye, as directed by your caregiver.   Protect your eye, as directed by your caregiver.   Use facial massage and exercises, as directed by your caregiver.   Perform your normal activities, and get your normal rest.  SEEK IMMEDIATE MEDICAL CARE IF:   There is pain, redness or irritation in the eye.   You or your child has an oral temperature above 102 F (38.9 C), not controlled by medicine.  MAKE SURE YOU:   Understand these instructions.   Will watch your condition.   Will get help right away if you are not doing well or get worse.  Document Released:  06/15/2005 Document Revised: 06/04/2011 Document Reviewed: 06/24/2009 [facility] Patient Information 2012 [place], [place].

## 2011-09-01 NOTE — ED Provider Notes (Signed)
Medical screening examination/treatment/procedure(s) were performed by non-physician practitioner and as supervising physician I was immediately available for consultation/collaboration.  Leslee Home, M.D.   Roque Lias, MD 09/01/11 [phone]

## 2012-02-17 ENCOUNTER — Encounter: Payer: Self-pay | Admitting: Internal Medicine

## 2012-02-18 ENCOUNTER — Ambulatory Visit (INDEPENDENT_AMBULATORY_CARE_PROVIDER_SITE_OTHER): Payer: BC Managed Care – PPO | Admitting: Internal Medicine

## 2012-02-18 ENCOUNTER — Ambulatory Visit (INDEPENDENT_AMBULATORY_CARE_PROVIDER_SITE_OTHER)
Admission: RE | Admit: 2012-02-18 | Discharge: 2012-02-18 | Disposition: A | Discharge status: Home / Self Care | Payer: BC Managed Care – PPO | Source: Ambulatory Visit | Attending: Internal Medicine | Admitting: Internal Medicine

## 2012-02-18 ENCOUNTER — Encounter: Payer: Self-pay | Admitting: Internal Medicine

## 2012-02-18 VITALS — BP 122/80 | HR 65 | Temp 97.7°F | Ht 65.0 in | Wt 262.8 lb

## 2012-02-18 DIAGNOSIS — R05 Cough: Secondary | ICD-10-CM

## 2012-02-18 DIAGNOSIS — R0609 Other forms of dyspnea: Secondary | ICD-10-CM

## 2012-02-18 DIAGNOSIS — R0600 Dyspnea, unspecified: Secondary | ICD-10-CM

## 2012-02-18 NOTE — Patient Instructions (Signed)
Try prilosec 20mg   Take 30-60 min before first meal of the day and Pepcid 20 mg one bedtime until cough is completely gone for at least a week  If not better by October 1 you need to return   GERD (REFLUX)  is an extremely common cause of respiratory symptoms, many times with no significant heartburn at all.    It can be treated with medication, but also with lifestyle changes including avoidance of late meals, excessive alcohol, smoking cessation, and avoid fatty foods, chocolate, peppermint, colas, red wine, and acidic juices such as orange juice.  NO MINT OR MENTHOL PRODUCTS SO NO COUGH DROPS  USE SUGARLESS CANDY INSTEAD (jolley ranchers or Stover's)  NO OIL BASED VITAMINS - use powdered substitutes.   Please remember to go to the   x-ray department downstairs for your tests - we will call you with the results when they are available and we will schedule your CT Chest.
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
## 2012-02-18 NOTE — Assessment & Plan Note (Signed)
-   02/18/2012  Walked RA x 3 laps @ 185 ft each stopped due to  End of study, no desat  No evidence of asbestosis or even ILD at this point. CT chest optimal.  Most likely this is obesity/deconditioning related.

## 2012-02-18 NOTE — Assessment & Plan Note (Signed)
The most common causes of chronic cough in immunocompetent adults include the following: upper airway cough syndrome (UACS), previously referred to as postnasal drip syndrome (PNDS), which is caused by variety of rhinosinus conditions; (2) asthma; (3) GERD; (4) chronic bronchitis from cigarette smoking or other inhaled environmental irritants; (5) nonasthmatic eosinophilic bronchitis; and (6) bronchiectasis.   These conditions, singly or in combination, have accounted for up to 94% of the causes of chronic cough in prospective studies.   Other conditions have constituted no >6% of the causes in prospective studies These have included bronchogenic carcinoma, chronic interstitial pneumonia, sarcoidosis, left ventricular failure, ACEI-induced cough, and aspiration from a condition associated with pharyngeal dysfunction.  This cough is typical of  Classic Upper airway cough syndrome, so named because it's frequently impossible to sort out how much is  CR/sinusitis with freq throat clearing (which can be related to primary GERD)   vs  causing  secondary (" extra esophageal")  GERD from wide swings in gastric pressure that occur with throat clearing, often  promoting self use of mint and menthol lozenges that reduce the lower esophageal sphincter tone and exacerbate the problem further in a cyclical fashion.   These are the same pts (now being labeled as having "irritable larynx syndrome" by some cough centers) who not infrequently have a history of having failed to tolerate ace inhibitors,  dry powder inhalers or biphosphonates or report having atypical reflux symptoms that don't respond to standard doses of PPI , and are easily confused as having aecopd or asthma flares by even experienced allergists/ pulmonologists.   Therefore rec trial off acei and gerd rx then regroup if not resolved p 6 weeks remembering that chronic cough is often simultaneously caused by more than one condition. A single cause has been  found from 38 to 82% of the time, multiple causes from 18 to 62%. Multiply caused cough has been the result of three diseases up to 42% of the time.

## 2012-02-22 ENCOUNTER — Other Ambulatory Visit: Payer: Self-pay | Admitting: Internal Medicine

## 2012-02-22 DIAGNOSIS — Z77090 Contact with and (suspected) exposure to asbestos: Secondary | ICD-10-CM

## 2012-02-22 NOTE — Progress Notes (Signed)
Quick Note:  Spoke with pt and notified of results per Dr. Wert. Pt verbalized understanding and denied any questions.  ______ 

## 2012-02-25 ENCOUNTER — Encounter: Payer: Self-pay | Admitting: Internal Medicine

## 2012-02-25 ENCOUNTER — Ambulatory Visit (INDEPENDENT_AMBULATORY_CARE_PROVIDER_SITE_OTHER)
Admission: RE | Admit: 2012-02-25 | Discharge: 2012-02-25 | Disposition: A | Discharge status: Home / Self Care | Payer: BC Managed Care – PPO | Source: Ambulatory Visit | Attending: Internal Medicine | Admitting: Internal Medicine

## 2012-02-25 DIAGNOSIS — R918 Other nonspecific abnormal finding of lung field: Secondary | ICD-10-CM | POA: Insufficient documentation

## 2012-02-25 DIAGNOSIS — Z77090 Contact with and (suspected) exposure to asbestos: Secondary | ICD-10-CM

## 2012-03-08 ENCOUNTER — Encounter: Payer: Self-pay | Admitting: *Deleted

## 2012-03-08 NOTE — Progress Notes (Signed)
Quick Note:  Will mail the pt letter to call for results ______

## 2012-03-17 ENCOUNTER — Telehealth: Payer: Self-pay | Admitting: Internal Medicine

## 2012-03-17 NOTE — Telephone Encounter (Signed)
Pt received letter to call for results of chest ct done 02/22/12.  I reviewed results/recs per MW and the pt verbalized understanding and denied any questions. He is aware that we will contact him when it's time to get the 12 month ct

## 2012-05-11 ENCOUNTER — Other Ambulatory Visit: Payer: Self-pay | Admitting: Cardiology

## 2012-05-11 ENCOUNTER — Other Ambulatory Visit: Payer: Self-pay | Admitting: Cardiovascular Disease

## 2012-05-11 ENCOUNTER — Ambulatory Visit
Admission: RE | Admit: 2012-05-11 | Discharge: 2012-05-11 | Disposition: A | Discharge status: Home / Self Care | Payer: BC Managed Care – PPO | Source: Ambulatory Visit | Attending: Cardiology | Admitting: Cardiology

## 2012-05-11 DIAGNOSIS — R079 Chest pain, unspecified: Secondary | ICD-10-CM

## 2012-05-11 DIAGNOSIS — R0602 Shortness of breath: Secondary | ICD-10-CM

## 2012-05-13 ENCOUNTER — Encounter (HOSPITAL_COMMUNITY)
Admission: RE | Disposition: A | Discharge status: Home / Self Care | Payer: Self-pay | Source: Ambulatory Visit | Attending: Cardiology

## 2012-05-13 ENCOUNTER — Ambulatory Visit (HOSPITAL_COMMUNITY)
Admission: RE | Admit: 2012-05-13 | Discharge: 2012-05-13 | Disposition: A | Discharge status: Home / Self Care | Payer: BC Managed Care – PPO | Source: Ambulatory Visit | Attending: Cardiology | Admitting: Cardiology

## 2012-05-13 DIAGNOSIS — E119 Type 2 diabetes mellitus without complications: Secondary | ICD-10-CM | POA: Insufficient documentation

## 2012-05-13 DIAGNOSIS — Z9861 Coronary angioplasty status: Secondary | ICD-10-CM | POA: Insufficient documentation

## 2012-05-13 DIAGNOSIS — Y831 Surgical operation with implant of artificial internal device as the cause of abnormal reaction of the patient, or of later complication, without mention of misadventure at the time of the procedure: Secondary | ICD-10-CM | POA: Insufficient documentation

## 2012-05-13 DIAGNOSIS — I2510 Atherosclerotic heart disease of native coronary artery without angina pectoris: Secondary | ICD-10-CM | POA: Insufficient documentation

## 2012-05-13 DIAGNOSIS — R079 Chest pain, unspecified: Secondary | ICD-10-CM | POA: Insufficient documentation

## 2012-05-13 DIAGNOSIS — I252 Old myocardial infarction: Secondary | ICD-10-CM | POA: Insufficient documentation

## 2012-05-13 DIAGNOSIS — T82897A Other specified complication of cardiac prosthetic devices, implants and grafts, initial encounter: Secondary | ICD-10-CM | POA: Insufficient documentation

## 2012-05-13 DIAGNOSIS — I10 Essential (primary) hypertension: Secondary | ICD-10-CM | POA: Insufficient documentation

## 2012-05-13 HISTORY — PX: LEFT HEART CATHETERIZATION WITH CORONARY ANGIOGRAM: SHX5451

## 2012-05-13 SURGERY — LEFT HEART CATHETERIZATION WITH CORONARY ANGIOGRAM
Anesthesia: LOCAL

## 2012-05-13 MED ORDER — LIDOCAINE HCL (PF) 1 % IJ SOLN
INTRAMUSCULAR | Status: AC
  Filled 2012-05-13: qty 30

## 2012-05-13 MED ORDER — ACETAMINOPHEN 325 MG PO TABS: 650.0000 mg | ORAL_TABLET | ORAL | Status: DC | PRN

## 2012-05-13 MED ORDER — HEPARIN (PORCINE) IN NACL 2-0.9 UNIT/ML-% IJ SOLN
INTRAMUSCULAR | Status: AC
  Filled 2012-05-13: qty 1000

## 2012-05-13 MED ORDER — MIDAZOLAM HCL 2 MG/2ML IJ SOLN
INTRAMUSCULAR | Status: AC
  Filled 2012-05-13: qty 2

## 2012-05-13 MED ORDER — INSULIN ASPART 100 UNIT/ML ~~LOC~~ SOLN: 0.0000 [IU] | SUBCUTANEOUS | Status: DC

## 2012-05-13 MED ORDER — SODIUM CHLORIDE 0.9 % IV SOLN: 1.0000 mL/kg/h | INTRAVENOUS | Status: DC

## 2012-05-13 MED ORDER — NITROGLYCERIN 0.2 MG/ML ON CALL CATH LAB
INTRAVENOUS | Status: AC
  Filled 2012-05-13: qty 1

## 2012-05-13 MED ORDER — SODIUM CHLORIDE 0.9 % IJ SOLN: 3.0000 mL | INTRAMUSCULAR | Status: DC | PRN

## 2012-05-13 MED ORDER — FENTANYL CITRATE 0.05 MG/ML IJ SOLN
INTRAMUSCULAR | Status: AC
  Filled 2012-05-13: qty 2

## 2012-05-13 MED ORDER — DIAZEPAM 5 MG PO TABS
5.0000 mg | ORAL_TABLET | ORAL | Status: AC
  Administered 2012-05-13: 5 mg via ORAL
  Filled 2012-05-13: qty 1

## 2012-05-13 MED ORDER — AMLODIPINE BESYLATE 2.5 MG PO TABS: 5.0000 mg | ORAL_TABLET | Freq: Every day | ORAL | Status: DC

## 2012-05-13 MED ORDER — SODIUM CHLORIDE 0.9 % IV SOLN
INTRAVENOUS | Status: DC
  Administered 2012-05-13: 11:00:00 via INTRAVENOUS

## 2012-05-13 NOTE — H&P (Signed)
History and Physical Interval Note:  NAME:  Trevor Jackson   MRN: [id number] DOB:  31-Oct-1960   ADMIT DATE: 05/13/2012   05/13/2012 2:58 PM  Chue Budner is a 51 y.o. male with a history of coronary disease status post emergency PTCA and PCI to occluded PDA with an inferior solution MI he also had a partial RCA stenting of the time. This was in September 2012. He also existing residual disease in the circumflex is an obese 70% as well as the LAD and a multiple port sites. He's done relatively well since PCI but over the last couple weeks has noted worsening exertional chest discomfort. He's having increasing episodes of this chest discomfort last month and a half with increased fatigue and limited episodes of discomfort every day requiring the use of 1 to sublingual nitroglycerin glycerin each day. He consumes associated with mild nausea shortness of breath and diaphoresis. His weight is stable he is in no illnesses. In addition in the chest pain he also notes having dyspnea on exertion and sometimes precedes the chest discomfort. He's been seen by other physicians or shortness of breath with no benefit using his Ventolin inhaler. He was then referred back to [facility]. He saw Nada Boozer, NP at [facility] on November 13. After initial evaluation by Ms. Annie Paras, I was asked to see the patient in order to determine the further course of action. Was my assessment that he has a least class III crescendo angina symptoms with known existing CAD. His blood pressure didn't control his lipids have been controlled and is using his CPAP for sleep apnea. Basilar nature of his symptoms I felt it was best to proceed with diagnostic catheterization plus or minus PCI as he is on beta blocker, nitrate and calcium channel blocker along with Dual Antiplatelet Therapy and is still requiring sublingual nitroglycerin use.   Past Medical History  Diagnosis  Date  . Coronary artery disease   . Hypertension   . Diabetes mellitus   . Myocardial infarction   . Benign head tremor   . Morbid obesity   . OSA (obstructive sleep apnea)    Past Surgical History  Procedure Date  . Shoulder surgery 2008  . Coronary angioplasty with stent placement 01-07-11    FAMHx: Family History  Problem Relation Age of Onset  . Heart disease Mother   . Heart disease Sister   . Bone cancer Father   . Prostate cancer Brother     SOCHx:  reports that he has never smoked. He has never used smokeless tobacco. He reports that he does not drink alcohol or use illicit drugs.  ALLERGIES: No Known Allergies  HOME MEDICATIONS: Prescriptions prior to admission  Medication Sig Dispense Refill  . albuterol (VENTOLIN HFA) 108 (90 BASE) MCG/ACT inhaler Inhale 2 puffs into the lungs every 6 (six) hours as needed. For shortness of breath      . amLODipine (NORVASC) 2.5 MG tablet Take 2.5 mg by mouth daily.      Marland Kitchen aspirin 81 MG tablet Take 81 mg by mouth daily.      Marland Kitchen buPROPion (WELLBUTRIN) 100 MG tablet Take 100 mg by mouth 2 (two) times daily.      . Cholecalciferol (VITAMIN D PO) Take 1 tablet by mouth daily.      . Exenatide (BYDUREON) 2 MG SUSR Inject 2 mg into the skin once a week.      . insulin glargine (LANTUS)  100 UNIT/ML injection Inject 20 Units into the skin at bedtime.      . isosorbide mononitrate (IMDUR) 60 MG 24 hr tablet Take 60 mg by mouth daily.      . Linagliptin-Metformin HCl 2.5-500 MG TABS Take by mouth 2 (two) times daily.      Marland Kitchen losartan (COZAAR) 100 MG tablet Take 100 mg by mouth daily.      . metoprolol (LOPRESSOR) 50 MG tablet Take 50 mg by mouth 2 (two) times daily.      . Multiple Vitamins-Minerals (MULTIVITAMINS THER. W/MINERALS) TABS Take 1 tablet by mouth daily.      . niacin (NIASPAN) 500 MG CR tablet Take 500 mg by mouth 2 (two) times daily.       . nitroGLYCERIN (NITROSTAT) 0.4 MG SL tablet Place 0.4 mg under the tongue every 5  (five) minutes as needed. For chest pain      . prasugrel (EFFIENT) 10 MG TABS Take 10 mg by mouth daily.      . rosuvastatin (CRESTOR) 40 MG tablet Take 40 mg by mouth 2 (two) times daily.       . sertraline (ZOLOFT) 100 MG tablet Take 100 mg by mouth daily.      Marland Kitchen venlafaxine XR (EFFEXOR XR) 37.5 MG 24 hr capsule Take 37.5 mg by mouth daily.      . vitamin C (ASCORBIC ACID) 500 MG tablet Take 500 mg by mouth daily.      Marland Kitchen zolpidem (AMBIEN) 10 MG tablet Take 10 mg by mouth at bedtime as needed. For sleep        PHYSICAL EXAM:Blood pressure 131/69, pulse 63, temperature 98 F (36.7 C), temperature source Oral, resp. rate 18, height 5\' 5"  (1.651 m), weight 118.389 kg (261 lb), SpO2 97.00%. General appearance: alert, cooperative, appears stated age and no distress Neck: no adenopathy, no carotid bruit, no JVD and supple, symmetrical, trachea midline Lungs: clear to auscultation bilaterally, normal percussion bilaterally and non-labored Heart: regular rate and rhythm, S1, S2 normal, no murmur, click, rub or gallop Abdomen: soft, non-tender; bowel sounds normal; no masses,  no organomegaly Extremities: extremities normal, atraumatic, no cyanosis or edema Pulses: 2+ and symmetric Neurologic: Grossly normal; CN II - XII grossly intact  IMPRESSION & PLAN The patients' history has been reviewed, patient examined, no change in status from most recent note, stable for surgery. I have reviewed the patients' chart and labs. Questions were answered to the patient's satisfaction.    Caison Vernier has presented today for surgery, with the diagnosis of chest pain The various methods of treatment have been discussed with the patient and family.   Risks / Complications include, but not limited to: Death, MI, CVA/TIA, VF/VT (with defibrillation), Bradycardia (need for temporary pacer placement), contrast induced nephropathy, bleeding / bruising / hematoma / pseudoaneurysm, vascular or coronary injury (with  possible emergent CT or Vascular Surgery), adverse medication reactions, infection.     After consideration of risks, benefits and other options for treatment, the patient has consented to Procedure(s):  LEFT HEART CATHETERIZATION AND CORONARY ANGIOGRAPHY +/- AD HOC PERCUTANEOUS CORONARY INTERVENTION   as a surgical intervention.   We will proceed with the planned procedure.   Enid Maultsby W [facility] [address] [place], [place]  [address]  [phone]  05/13/2012 2:58 PM

## 2012-05-13 NOTE — CV Procedure (Signed)
[facility] PERCUTANEOUS CORONARY INTERVENTION REPORT  NAME:  Trevor Jackson   MRN: [id number] DOB:  12/03/1960   ADMIT DATE: 05/13/2012  INTERVENTIONAL CARDIOLOGIST: Marykay Lex, M.D., MS PRIMARY CARE PROVIDER: MABE,DAVID, NP PRIMARY CARDIOLOGIST:   PATIENT:  Trevor Jackson is a 51 y.o. male with a history of coronary disease status post emergency PTCA and PCI to occluded PDA with an inferior solution MI he also had a partial RCA stenting of the time. This was in September 2012. He also existing residual disease in the circumflex is an obese 70% as well as the LAD and a multiple port sites. He's done relatively well since PCI but over the last couple weeks has noted worsening exertional chest discomfort. He's having increasing episodes of this chest discomfort last month and a half with increased fatigue and limited episodes of discomfort every day requiring the use of 1 to sublingual nitroglycerin glycerin each day.  He was referred back to [facility], where he saw Nada Boozer, NP yesterday (05/12/12).Marland Kitchen After initial evaluation by Ms. Annie Paras, I was asked to see the patient in order to determine the further course of action. Was my assessment that he has a least class III crescendo angina symptoms with known existing CAD. Based upon nature of his symptoms I felt it was best to proceed with diagnostic catheterization plus or minus PCI as he is on beta blocker, nitrate and calcium channel blocker along with Dual Antiplatelet Therapy and is still requiring sublingual nitroglycerin use.  PRE-OPERATIVE DIAGNOSIS:    Crescendo (Class III) Angina  Known CAD  PROCEDURES PERFORMED:    Left Heart Catheterization with Native Coronary Angiography via Right Radial Artery Access  Left Ventriculography  PROCEDURE: Consent:  Risks of procedure as well as the alternatives and risks of each were explained to the (patient/caregiver).  Consent  for procedure obtained. Consent for signed by MD and patient with RN witness -- placed on chart.  PROCEDURE: The patient was brought to the 2nd Floor [facility] in the fasting state and prepped and draped in the usual sterile fashion for Right Radial access, after a modified Allen's test with plethysmography demonstrated excellent Ulnar artery collateral flow.. Sterile technique was used including antiseptics, cap, gloves, gown, hand hygiene, mask and sheet.  Skin prep: Chlorhexidine.  Time Out: Verified patient identification, verified procedure, site/side was marked, verified correct patient position, special equipment/implants available, medications/allergies/relevent history reviewed, required imaging and test results available.  Performed  Access: Right Radial Artery; 5 Fr Sheath Diagnostic: 5 Fr IG 4.0,  Catheter advanced over Long-exchange Safety-J wire into ascending aorta.  Left & Right Coronary Artery Angiography:  TIG 4.0  LV Hemodynamics (LV Gram): Angled pigtail.  Hemodynamics:  Central Aortic / Mean Pressures:  134/80  mmHg; 96 mmHg;   Left Ventricular Pressures / EDP: 135/12 mmHg;  Left Ventriculography:  EF: 55%   Wall Motion: mild basal inferior hypokinesis.  Coronary Anatomy:  Left Main: Large caliber vessel that bifurcates into LAD & Circumflex. LAD: Moderate to large caliber vessel with ~40% proximal stenosis, most notable lesion is at the D2 branch point ~70% focal lesion.  D1: Small caliber vessel, diffuse luminal irregularities  D2: small to moderate caliber vessel with ostial ~60% stenosis as part of the bifurcation lesion. Left Circumflex: Large caliber vessel that gives off an early (insiginificant) OM followed by ~80% stenosis just before bifurcating to AVGroove Circumflex & Lateral OM@.  OM 2: Moderate to  large caliber vessel, proximal 50-60% stenosis  AVGroove Circumflex  Is a moderate caliber vessel that gives off  a small LPL branch before terminating as an LPL branch in the posterior AV Groove.  RCA: Large caliber vessel with stent in the mid vessel, ~60% hazy ISR, diffuse luminl irregularities with ~40-50% distal stenosis prior to bifurcation.  RPDA: Small to moderate caliber vessel with proximal ~80% stenosis.  RPL Sysytem:The Right Posterior AV Groove Branch begins as a small to moderate   TR Band:  1615 Hours, 10 mL air  ANESTHESIA:   Local Lidocaine 2 ml SEDATION:  2 mg IV Versed, 50 mcg IV fentanyl ; Premedication: 5 mg PO Valium MEDICATIONS: Radial Cocktail: 5 mg Verapamil, 400 mcg NTG, 2 ml 2% Lidocaine in 10 ml NS  IC NTG 200 mcg x 1  EBL:   < 5 ml  PATIENT DISPOSITION:    The patient was transferred to the PACU holding area in a hemodynamicaly stable, chest pain free condition.  The patient tolerated the procedure well, and there were no complications.  The patient was stable before, during, and after the procedure.  POST-OPERATIVE DIAGNOSIS:    Moderate to severe multivessel CAD involving mid RCA ISR with proximal RPAV & RPDA  80%, LC-OM/AVG Cx bifurcation ~70-80%, LAD-Diag ~70% focal lesion.  Preserved LVEF with normal LVEDP.  PLAN OF CARE:  Post Radial care, with plan for OP CT Surgical Consultation to consider CABG.  Hold Effient until seen by CT Sgx  Increase home dose of Effient.   Marykay Lex, M.D., M.S. [facility] [address] [place], [place]  [address]  [phone]  05/13/2012 5:01 PM

## 2012-05-18 ENCOUNTER — Institutional Professional Consult (permissible substitution) (INDEPENDENT_AMBULATORY_CARE_PROVIDER_SITE_OTHER): Payer: BC Managed Care – PPO | Admitting: Thoracic Surgery (Cardiothoracic Vascular Surgery)

## 2012-05-18 ENCOUNTER — Encounter: Payer: Self-pay | Admitting: Thoracic Surgery (Cardiothoracic Vascular Surgery)

## 2012-05-18 ENCOUNTER — Encounter: Payer: BC Managed Care – PPO | Admitting: Thoracic Surgery (Cardiothoracic Vascular Surgery)

## 2012-05-18 VITALS — BP 126/88 | HR 72 | Resp 18 | Ht 65.0 in | Wt 261.0 lb

## 2012-05-18 DIAGNOSIS — E785 Hyperlipidemia, unspecified: Secondary | ICD-10-CM | POA: Insufficient documentation

## 2012-05-18 DIAGNOSIS — I10 Essential (primary) hypertension: Secondary | ICD-10-CM | POA: Insufficient documentation

## 2012-05-18 DIAGNOSIS — I2510 Atherosclerotic heart disease of native coronary artery without angina pectoris: Secondary | ICD-10-CM | POA: Insufficient documentation

## 2012-05-18 DIAGNOSIS — F419 Anxiety disorder, unspecified: Secondary | ICD-10-CM

## 2012-05-18 DIAGNOSIS — R51 Headache: Secondary | ICD-10-CM

## 2012-05-18 DIAGNOSIS — F32A Depression, unspecified: Secondary | ICD-10-CM

## 2012-05-18 DIAGNOSIS — G4733 Obstructive sleep apnea (adult) (pediatric): Secondary | ICD-10-CM

## 2012-05-18 DIAGNOSIS — R519 Headache, unspecified: Secondary | ICD-10-CM | POA: Insufficient documentation

## 2012-05-18 HISTORY — DX: Morbid (severe) obesity due to excess calories: E66.01

## 2012-05-18 HISTORY — DX: Atherosclerotic heart disease of native coronary artery without angina pectoris: I25.10

## 2012-05-18 HISTORY — DX: Anxiety disorder, unspecified: F41.9

## 2012-05-18 HISTORY — DX: Depression, unspecified: F32.A

## 2012-05-18 HISTORY — DX: Headache: R51

## 2012-05-18 HISTORY — DX: Obstructive sleep apnea (adult) (pediatric): G47.33

## 2012-05-18 NOTE — Progress Notes (Signed)
[address]            [place] [address]          [phone]     CARDIOTHORACIC SURGERY CONSULTATION REPORT  Referring Provider is Marykay Lex, MD Primary Cardiologist is Nanetta Batty, MD PCP is Hayden Rasmussen, NP  Chief Complaint  Patient presents with  . Coronary Artery Disease    Referrel from Dr Herbie Baltimore for surgical eval on CAD, Cardiac Cath 05/13/12    HPI:  Patient is a 51 year old obese African American male from [place] with known history of coronary artery disease status post acute inferior wall myocardial infarction in July of 2012. The patient was treated with PCI and stenting of the right coronary artery using a drug-eluting stent at that time. Other comorbid medical problems include hypertension, type 2 diabetes mellitus, obesity, hyperlipidemia, obstructive sleep apnea, and a strong family history of coronary artery disease. The patient states that he did fairly well initially after he recovered from his myocardial infarction in 2012. However, he since then has been developed recurrent angina pectoris which has accelerated considerably over the last 2 months. The patient describes classical symptoms of substernal chest tightness brought on with physical activity and relieved by rest or sublingual nitroglycerin. These symptoms currently are her nearly every day or brought on with mild activity and occasionally occurring at rest despite the fact that the patient is currently on maximal 3-drug  medical therapy. Symptoms are always promptly relieved with administration of sublingual nitroglycerin. Chest tightness is associated with shortness breath and diaphoresis. The patient describes chronic exertional shortness of breath otherwise which has not changed substantially over time. Patient denies resting shortness of breath, PND, orthopnea, lower extremity edema, palpitations, or syncope.  The patient was seen in followup at the [facility]  heart and vascular center last week and probably scheduled for cardiac catheterization that was performed by Dr. Herbie Baltimore. This demonstrates severe three-vessel coronary artery disease with preserved left ventricular function. The patient has been referred to consider elective surgical revascularization.  Past Medical History  Diagnosis Date  . Hypertension   . Diabetes mellitus   . Benign head tremor   . OSA (obstructive sleep apnea)   . Anxiety   . Coronary artery disease 05/18/2012  . Morbid obesity with BMI of 40.0-44.9, adult 05/18/2012  . Hyperlipidemia   . Headache 05/18/2012  . Anxiety and depression 05/18/2012  . Obstructive sleep apnea 05/18/2012    On CPAP  . Myocardial infarction 01/07/2011    Past Surgical History  Procedure Date  . Shoulder surgery 2008  . Coronary angioplasty with stent placement 01-07-11  . Kidney stone removal     Family History  Problem Relation Age of Onset  . Heart disease Mother   . Heart disease Sister   . Bone cancer Father   . Prostate cancer Brother   . Hyperlipidemia Mother   . Hypertension Mother   . Diabetes Sister   . Hyperlipidemia Sister   . Hypertension Sister   . Hyperlipidemia Brother   . Hypertension Brother     History   Social History  . Marital Status: Married    Spouse Name: N/A    Number of Children: 2  . Years of Education: N/A   Occupational History  . disabled after MI    Social History Main Topics  . Smoking status: Never Smoker   .  Smokeless tobacco: Never Used  . Alcohol Use: No  . Drug Use: No  . Sexually Active: Not on file   Other Topics Concern  . Not on file   Social History Narrative   On disability since heart attack in 2012, previously employed as Retail banker    Current Outpatient Prescriptions  Medication Sig Dispense Refill  . albuterol (VENTOLIN HFA) 108 (90 BASE) MCG/ACT inhaler Inhale 2 puffs into the lungs every 6 (six) hours as needed. For shortness of breath      .  amLODipine (NORVASC) 2.5 MG tablet Take 2 tablets (5 mg total) by mouth daily.  30 tablet  2  . aspirin 81 MG tablet Take 81 mg by mouth daily.      Marland Kitchen buPROPion (WELLBUTRIN) 100 MG tablet Take 100 mg by mouth 2 (two) times daily.      . Cholecalciferol (VITAMIN D PO) Take 1 tablet by mouth daily.      . Exenatide (BYDUREON) 2 MG SUSR Inject 2 mg into the skin once a week.      . insulin glargine (LANTUS) 100 UNIT/ML injection Inject 20 Units into the skin at bedtime.      . isosorbide mononitrate (IMDUR) 60 MG 24 hr tablet Take 60 mg by mouth daily.      . Linagliptin-Metformin HCl 2.5-500 MG TABS Take by mouth 2 (two) times daily.      Marland Kitchen losartan (COZAAR) 100 MG tablet Take 100 mg by mouth daily.      . metoprolol (LOPRESSOR) 50 MG tablet Take 50 mg by mouth 2 (two) times daily.      . Multiple Vitamins-Minerals (MULTIVITAMINS THER. W/MINERALS) TABS Take 1 tablet by mouth daily.      . niacin (NIASPAN) 500 MG CR tablet Take 500 mg by mouth 2 (two) times daily.       . nitroGLYCERIN (NITROSTAT) 0.4 MG SL tablet Place 0.4 mg under the tongue every 5 (five) minutes as needed. For chest pain      . rosuvastatin (CRESTOR) 40 MG tablet Take 40 mg by mouth 2 (two) times daily.       . sertraline (ZOLOFT) 100 MG tablet Take 100 mg by mouth daily.      Marland Kitchen venlafaxine XR (EFFEXOR XR) 37.5 MG 24 hr capsule Take 37.5 mg by mouth daily.      . vitamin C (ASCORBIC ACID) 500 MG tablet Take 500 mg by mouth daily.      Marland Kitchen zolpidem (AMBIEN) 10 MG tablet Take 10 mg by mouth at bedtime as needed. For sleep        No Known Allergies    Review of Systems:   General:  normal appetite, normal energy, no recent weight gain, no weight loss, no fever  Cardiac:  + frequent chest pain with exertion, occasional chest pain at rest, + SOB with moderate exertion, no resting SOB, no PND, no orthopnea, no palpitations, no arrhythmia, no atrial fibrillation, no LE edema, no dizzy spells, no syncope  Respiratory:  + shortness  of breath, no home oxygen, no productive cough, + chronic dry cough, no bronchitis, + wheezing, no hemoptysis, + asthma, no pain with inspiration or cough, + sleep apnea, + CPAP at night  GI:   NO difficulty swallowing, NO reflux, no frequent heartburn, no hiatal hernia, no abdominal pain, no constipation, no diarrhea, no hematochezia, no hematemesis, no melena  GU:   no dysuria,  no frequency, no urinary tract infection, no hematuria, no  enlarged prostate, + kidney stones, no kidney disease  Vascular:  + pain suggestive of claudication occurs in both lower legs, no pain in feet, + leg cramps, no varicose veins, no DVT, no non-healing foot ulcer  Neuro:   no stroke, no TIA's, no seizures, + headaches, no temporary blindness one eye,  + occasional slurred speech, no peripheral neuropathy, no chronic pain, no instability of gait, + memory/cognitive dysfunction  Musculoskeletal: no arthritis, no joint swelling, + myalgias, no difficulty walking, normal mobility   Skin:   no rash, no itching, no skin infections, no pressure sores or ulcerations  Psych:   + anxiety, + depression, + nervousness, + unusual recent stress  Eyes:   no blurry vision, no floaters, no recent vision changes, + wears glasses or contacts  ENT:   no hearing loss, no loose or painful teeth, no dentures  Hematologic:  no easy bruising, no abnormal bleeding, no clotting disorder, no frequent epistaxis  Endocrine:   diabetes, checks CBGs routinely at home and reports good control     Physical Exam:   BP 126/88  Pulse 72  Resp 18  Ht 5\' 5"  (1.651 m)  Wt 261 lb (118.389 kg)  BMI 43.43 kg/m2  SpO2 97%  General:  Obese but  well-appearing  HEENT:  Unremarkable   Neck:   no JVD, no bruits, no adenopathy   Chest:   clear to auscultation, symmetrical breath sounds, no wheezes, no rhonchi   CV:   RRR, no murmur   Abdomen:  soft, non-tender, no masses   Extremities:  warm, well-perfused, pulses palpable, no LE edema, Allen's test  normal on left side  Rectal/GU  Deferred  Neuro:   Grossly non-focal and symmetrical throughout  Skin:   Clean and dry, no rashes, no breakdown   Diagnostic Tests:  [facility]  PERCUTANEOUS CORONARY INTERVENTION REPORT  NAME: Carey Johndrow MRN: [id number]  DOB: [birth year] ADMIT DATE: 05/13/2012  INTERVENTIONAL CARDIOLOGIST: Marykay Lex, M.D., MS  PRIMARY CARE PROVIDER: MABE,DAVID, NP  PRIMARY CARDIOLOGIST:  PATIENT: Korde Jeppsen is a 51 y.o. male with a history of coronary disease status post emergency PTCA and PCI to occluded PDA with an inferior solution MI he also had a partial RCA stenting of the time. This was in September 2012. He also existing residual disease in the circumflex is an obese 70% as well as the LAD and a multiple port sites. He's done relatively well since PCI but over the last couple weeks has noted worsening exertional chest discomfort. He's having increasing episodes of this chest discomfort last month and a half with increased fatigue and limited episodes of discomfort every day requiring the use of 1 to sublingual nitroglycerin glycerin each day.  He was referred back to [facility], where he saw Nada Boozer, NP yesterday (05/12/12).Marland Kitchen After initial evaluation by Ms. Annie Paras, I was asked to see the patient in order to determine the further course of action. Was my assessment that he has a least class III crescendo angina symptoms with known existing CAD.  Based upon nature of his symptoms I felt it was best to proceed with diagnostic catheterization plus or minus PCI as he is on beta blocker, nitrate and calcium channel blocker along with Dual Antiplatelet Therapy and is still requiring sublingual nitroglycerin use.  PRE-OPERATIVE DIAGNOSIS:  Crescendo (Class III) Angina  Known CAD PROCEDURES PERFORMED:  Left Heart Catheterization with Native Coronary Angiography via Right Radial Artery Access  Left  Ventriculography PROCEDURE:  Consent: Risks of procedure as well as the alternatives and risks of each were explained to the (patient/caregiver). Consent for procedure obtained.  Consent for signed by MD and patient with RN witness -- placed on chart.  PROCEDURE: The patient was brought to the 2nd Floor [facility] in the fasting state and prepped and draped in the usual sterile fashion for Right Radial access, after a modified Allen's test with plethysmography demonstrated excellent Ulnar artery collateral flow.. Sterile technique was used including antiseptics, cap, gloves, gown, hand hygiene, mask and sheet. Skin prep: Chlorhexidine.  Time Out: Verified patient identification, verified procedure, site/side was marked, verified correct patient position, special equipment/implants available, medications/allergies/relevent history reviewed, required imaging and test results available. Performed  Access: Right Radial Artery; 5 Fr Sheath  Diagnostic: 5 Fr IG 4.0, Catheter advanced over Long-exchange Safety-J wire into ascending aorta.  Left & Right Coronary Artery Angiography: TIG 4.0  LV Hemodynamics (LV Gram): Angled pigtail. Hemodynamics:  Central Aortic / Mean Pressures: 134/80 mmHg; 96 mmHg;  Left Ventricular Pressures / EDP: 135/12 mmHg; Left Ventriculography:  EF: 55%  Wall Motion: mild basal inferior hypokinesis. Coronary Anatomy:  Left Main: Large caliber vessel that bifurcates into LAD & Circumflex. LAD: Moderate to large caliber vessel with ~40% proximal stenosis, most notable lesion is at the D2 branch point ~70% focal lesion.  D1: Small caliber vessel, diffuse luminal irregularities  D2: small to moderate caliber vessel with ostial ~60% stenosis as part of the bifurcation lesion. Left Circumflex: Large caliber vessel that gives off an early (insiginificant) OM followed by ~80% stenosis just before bifurcating to AVGroove Circumflex & Lateral OM@.  OM  2: Moderate to large caliber vessel, proximal 50-60% stenosis  AVGroove Circumflex Is a moderate caliber vessel that gives off a small LPL branch before terminating as an LPL branch in the posterior AV Groove. RCA: Large caliber vessel with stent in the mid vessel, ~60% hazy ISR, diffuse luminl irregularities with ~40-50% distal stenosis prior to bifurcation.  RPDA: Small to moderate caliber vessel with proximal ~80% stenosis.  RPL Sysytem:The Right Posterior AV Groove Branch begins as a small to moderate  TR Band: 1615 Hours, 10 mL air  ANESTHESIA: Local Lidocaine 2 ml  SEDATION: 2 mg IV Versed, 50 mcg IV fentanyl ; Premedication: 5 mg PO Valium  MEDICATIONS: Radial Cocktail: 5 mg Verapamil, 400 mcg NTG, 2 ml 2% Lidocaine in 10 ml NS  IC NTG 200 mcg x 1  EBL: < 5 ml  PATIENT DISPOSITION:  The patient was transferred to the PACU holding area in a hemodynamicaly stable, chest pain free condition.  The patient tolerated the procedure well, and there were no complications.  The patient was stable before, during, and after the procedure. POST-OPERATIVE DIAGNOSIS:  Moderate to severe multivessel CAD involving mid RCA ISR with proximal RPAV & RPDA 80%, LC-OM/AVG Cx bifurcation ~70-80%, LAD-Diag ~70% focal lesion.  Preserved LVEF with normal LVEDP. PLAN OF CARE:  Post Radial care, with plan for OP CT Surgical Consultation to consider CABG.  Hold Effient until seen by CT Sgx  Increase home dose of Effient. Marykay Lex, M.D., M.S.  [facility]  [address]  [place], [place] [address]  [phone]  05/13/2012  5:01 PM       Impression:  Patient is a 63--year-old African American male with history of inferior wall myocardial infarction in 2012 treated with PCI and stenting of  the right coronary artery. The patient has now developed crescendo symptoms of angina pectoris despite 3 drug maximal medical therapy. I have personally reviewed the  patient's recent cardiac catheterization which demonstrates severe three-vessel coronary artery disease with preserved left ventricular function. I agree that surgical revascularization would probably be best treatment option at this time.   Plan:  I've discussed the indications, risks, and potential benefits of coronary artery bypass grafting at length with the patient and his wife here in the office today. Alternative treatment strategies been discussed including continued medical therapy versus multivessel PCI. The relative risks and benefits of all these approaches have been discussed including perspective of the patient's age, comorbid medical conditions, and coronary anatomy.  We spent in excess of 60 minutes discussing matters at length and I've offered to schedule the patient for surgery as early as later this week.  The patient desires to wait until then for variety of personal reasons.  We tentatively plan to proceed with elective coronary artery bypass grafting on Wednesday, December 11.  We will see the patient back here in the office on Monday, December 9 prior to surgery. The patient has stopped taking Effient and will remain off of this medication indefinitely. The patient has been advised to call EMS and present throughout the emergency room should he develop chest pain unrelieved by the administration of sublingual nitroglycerin. All of his questions been addressed.    Salvatore Decent. Cornelius Moras, MD 05/18/2012 5:16 PM

## 2012-05-18 NOTE — Patient Instructions (Signed)
Use nitroglycerine as directed and call EMS if chest pain persists unrelieved by nitroglycerine.  Do not take Effient.

## 2012-05-19 ENCOUNTER — Other Ambulatory Visit: Payer: Self-pay | Admitting: Thoracic Surgery (Cardiothoracic Vascular Surgery)

## 2012-05-19 ENCOUNTER — Other Ambulatory Visit: Payer: Self-pay

## 2012-05-19 DIAGNOSIS — I2510 Atherosclerotic heart disease of native coronary artery without angina pectoris: Secondary | ICD-10-CM

## 2012-06-02 ENCOUNTER — Encounter (HOSPITAL_COMMUNITY): Payer: Self-pay | Admitting: Pharmacy Technician

## 2012-06-06 ENCOUNTER — Ambulatory Visit (INDEPENDENT_AMBULATORY_CARE_PROVIDER_SITE_OTHER): Payer: BC Managed Care – PPO | Admitting: Thoracic Surgery (Cardiothoracic Vascular Surgery)

## 2012-06-06 ENCOUNTER — Ambulatory Visit (HOSPITAL_COMMUNITY)
Admission: RE | Admit: 2012-06-06 | Discharge: 2012-06-06 | Disposition: A | Discharge status: Home / Self Care | Payer: BC Managed Care – PPO | Source: Ambulatory Visit | Attending: Thoracic Surgery (Cardiothoracic Vascular Surgery) | Admitting: Thoracic Surgery (Cardiothoracic Vascular Surgery)

## 2012-06-06 ENCOUNTER — Encounter: Payer: Self-pay | Admitting: Thoracic Surgery (Cardiothoracic Vascular Surgery)

## 2012-06-06 ENCOUNTER — Encounter (HOSPITAL_COMMUNITY)
Admission: RE | Admit: 2012-06-06 | Discharge: 2012-06-06 | Disposition: A | Discharge status: Home / Self Care | Payer: BC Managed Care – PPO | Source: Ambulatory Visit | Attending: Thoracic Surgery (Cardiothoracic Vascular Surgery) | Admitting: Thoracic Surgery (Cardiothoracic Vascular Surgery)

## 2012-06-06 ENCOUNTER — Encounter (HOSPITAL_COMMUNITY): Payer: Self-pay

## 2012-06-06 ENCOUNTER — Inpatient Hospital Stay (HOSPITAL_COMMUNITY)
Admission: RE | Admit: 2012-06-06 | Discharge: 2012-06-06 | Disposition: A | Discharge status: Home / Self Care | Payer: BC Managed Care – PPO | Source: Ambulatory Visit | Attending: Thoracic Surgery (Cardiothoracic Vascular Surgery) | Admitting: Thoracic Surgery (Cardiothoracic Vascular Surgery)

## 2012-06-06 VITALS — BP 131/78 | HR 72 | Resp 18 | Ht 65.0 in | Wt 261.0 lb

## 2012-06-06 VITALS — BP 120/77 | HR 66 | Temp 98.0°F | Resp 20 | Ht 65.0 in | Wt 263.4 lb

## 2012-06-06 DIAGNOSIS — I2510 Atherosclerotic heart disease of native coronary artery without angina pectoris: Secondary | ICD-10-CM

## 2012-06-06 DIAGNOSIS — Z01810 Encounter for preprocedural cardiovascular examination: Secondary | ICD-10-CM | POA: Insufficient documentation

## 2012-06-06 LAB — URINALYSIS, ROUTINE W REFLEX MICROSCOPIC
Glucose, UA: >1000 mg/dL — AB
Leukocytes, UA: NEGATIVE
Specific Gravity, Urine: 1.035 — ABNORMAL HIGH (ref 1.005–1.030)
pH: 5 (ref 5.0–8.0)

## 2012-06-06 LAB — COMPREHENSIVE METABOLIC PANEL
Alkaline Phosphatase: 56 U/L (ref 39–117)
BUN: 14 mg/dL (ref 6–23)
Chloride: 103 mEq/L (ref 96–112)
Creatinine, Ser: 0.72 mg/dL (ref 0.50–1.35)
GFR calc Af Amer: >90 mL/min (ref >90)
Glucose, Bld: 311 mg/dL — ABNORMAL HIGH (ref 70–99)
Potassium: 4.4 mEq/L (ref 3.5–5.1)
Total Bilirubin: 0.6 mg/dL (ref 0.3–1.2)

## 2012-06-06 LAB — CBC
HCT: 41.5 % (ref 39.0–52.0)
Hemoglobin: 14.1 g/dL (ref 13.0–17.0)
MCHC: 34 g/dL (ref 30.0–36.0)
MCV: 84 fL (ref 78.0–100.0)

## 2012-06-06 LAB — BLOOD GAS, ARTERIAL
Acid-Base Excess: 0.7 mmol/L (ref 0.0–2.0)
Bicarbonate: 25.2 mEq/L — ABNORMAL HIGH (ref 20.0–24.0)
Patient temperature: 98.6
TCO2: 26.5 mmol/L (ref 0–100)
pH, Arterial: 7.385 (ref 7.350–7.450)

## 2012-06-06 LAB — SURGICAL PCR SCREEN
MRSA, PCR: NEGATIVE
Staphylococcus aureus: NEGATIVE

## 2012-06-06 LAB — HEMOGLOBIN A1C
Hgb A1c MFr Bld: 11.1 % — ABNORMAL HIGH (ref <5.7)
Mean Plasma Glucose: 272 mg/dL — ABNORMAL HIGH (ref <117)

## 2012-06-06 LAB — URINE MICROSCOPIC-ADD ON

## 2012-06-06 LAB — PULMONARY FUNCTION TEST

## 2012-06-06 MED ORDER — ALBUTEROL SULFATE (5 MG/ML) 0.5% IN NEBU
2.5000 mg | INHALATION_SOLUTION | Freq: Once | RESPIRATORY_TRACT | Status: AC
  Administered 2012-06-06: 2.5 mg via RESPIRATORY_TRACT

## 2012-06-06 NOTE — H&P (Signed)
CARDIOTHORACIC SURGERY HISTORY AND PHYSICAL EXAM  Referring Provider is Marykay Lex, MD Primary Cardiologist is Nanetta Batty, MD PCP is Hayden Rasmussen, NP    Chief Complaint   Patient presents with   .  Coronary Artery Disease       Referrel from Dr Herbie Baltimore for surgical eval on CAD, Cardiac Cath 05/13/12     HPI:  Patient is a 51 year old obese African American male from [place] with known history of coronary artery disease status post acute inferior wall myocardial infarction in July of 2012. The patient was treated with PCI and stenting of the right coronary artery using a drug-eluting stent at that time. Other comorbid medical problems include hypertension, type 2 diabetes mellitus, obesity, hyperlipidemia, obstructive sleep apnea, and a strong family history of coronary artery disease. The patient states that he did fairly well initially after he recovered from his myocardial infarction in 2012. However, he since then has been developed recurrent angina pectoris which has accelerated considerably over the last 2 months. The patient describes classical symptoms of substernal chest tightness brought on with physical activity and relieved by rest or sublingual nitroglycerin. These symptoms currently are her nearly every day or brought on with mild activity and occasionally occurring at rest despite the fact that the patient is currently on maximal 3-drug  medical therapy. Symptoms are always promptly relieved with administration of sublingual nitroglycerin. Chest tightness is associated with shortness breath and diaphoresis. The patient describes chronic exertional shortness of breath otherwise which has not changed substantially over time. Patient denies resting shortness of breath, PND, orthopnea, lower extremity edema, palpitations, or syncope.  The patient was seen in followup at the [facility] heart and vascular center last week and probably scheduled for cardiac  catheterization that was performed by Dr. Herbie Baltimore. This demonstrates severe three-vessel coronary artery disease with preserved left ventricular function. The patient was referred to consider elective surgical revascularization. He was originally seen in consultation on 05/18/2012. Since then he has remained clinically stable. He reports occasional mild transient substernal chest pain occurring primarily with physical activity. He has not had any prolonged episodes of chest pain nor has he had any chest pain severe enough to want taking sublingual nitroglycerin. He has stable exertional shortness of breath. He reports no new problems and his review of systems otherwise remains unchanged from previously. He is ready to proceed with surgery as scheduled later this week.     Past Medical History   Diagnosis  Date   .  Hypertension     .  Diabetes mellitus     .  Benign head tremor     .  OSA (obstructive sleep apnea)     .  Anxiety     .  Coronary artery disease  05/18/2012   .  Morbid obesity with BMI of 40.0-44.9, adult  05/18/2012   .  Hyperlipidemia     .  Headache  05/18/2012   .  Anxiety and depression  05/18/2012   .  Obstructive sleep apnea  05/18/2012       On CPAP   .  Myocardial infarction  01/07/2011       Past Surgical History   Procedure  Date   .  Shoulder surgery  2008   .  Coronary angioplasty with stent placement  01-07-11   .  Kidney stone removal         Family History   Problem  Relation  Age of Onset   .  Heart disease  Mother     .  Heart disease  Sister     .  Bone cancer  Father     .  Prostate cancer  Brother     .  Hyperlipidemia  Mother     .  Hypertension  Mother     .  Diabetes  Sister     .  Hyperlipidemia  Sister     .  Hypertension  Sister     .  Hyperlipidemia  Brother     .  Hypertension  Brother         History      Social History   .  Marital Status:  Married       Spouse Name:  N/A       Number of Children:  2   .  Years of Education:   N/A      Occupational History   .  disabled after MI        Social History Main Topics   .  Smoking status:  Never Smoker    .  Smokeless tobacco:  Never Used   .  Alcohol Use:  No   .  Drug Use:  No   .  Sexually Active:  Not on file      Other Topics  Concern   .  Not on file      Social History Narrative     On disability since heart attack in 2012, previously employed as Retail banker       Current Outpatient Prescriptions   Medication  Sig  Dispense  Refill   .  albuterol (VENTOLIN HFA) 108 (90 BASE) MCG/ACT inhaler  Inhale 2 puffs into the lungs every 6 (six) hours as needed. For shortness of breath         .  amLODipine (NORVASC) 2.5 MG tablet  Take 2 tablets (5 mg total) by mouth daily.   30 tablet   2   .  aspirin 81 MG tablet  Take 81 mg by mouth daily.         Marland Kitchen  buPROPion (WELLBUTRIN) 100 MG tablet  Take 100 mg by mouth 2 (two) times daily.         .  Cholecalciferol (VITAMIN D PO)  Take 1 tablet by mouth daily.         .  Exenatide (BYDUREON) 2 MG SUSR  Inject 2 mg into the skin once a week.         .  insulin glargine (LANTUS) 100 UNIT/ML injection  Inject 20 Units into the skin at bedtime.         .  isosorbide mononitrate (IMDUR) 60 MG 24 hr tablet  Take 60 mg by mouth daily.         .  Linagliptin-Metformin HCl 2.5-500 MG TABS  Take by mouth 2 (two) times daily.         Marland Kitchen  losartan (COZAAR) 100 MG tablet  Take 100 mg by mouth daily.         .  metoprolol (LOPRESSOR) 50 MG tablet  Take 50 mg by mouth 2 (two) times daily.         .  Multiple Vitamins-Minerals (MULTIVITAMINS THER. W/MINERALS) TABS  Take 1 tablet by mouth daily.         .  niacin (NIASPAN) 500 MG CR tablet  Take 500 mg by mouth 2 (  two) times daily.          .  nitroGLYCERIN (NITROSTAT) 0.4 MG SL tablet  Place 0.4 mg under the tongue every 5 (five) minutes as needed. For chest pain         .  rosuvastatin (CRESTOR) 40 MG tablet  Take 40 mg by mouth 2 (two) times daily.          .  sertraline  (ZOLOFT) 100 MG tablet  Take 100 mg by mouth daily.         Marland Kitchen  venlafaxine XR (EFFEXOR XR) 37.5 MG 24 hr capsule  Take 37.5 mg by mouth daily.         .  vitamin C (ASCORBIC ACID) 500 MG tablet  Take 500 mg by mouth daily.         Marland Kitchen  zolpidem (AMBIEN) 10 MG tablet  Take 10 mg by mouth at bedtime as needed. For sleep           No Known Allergies    Review of Systems:              General:                      normal appetite, normal energy, no recent weight gain, no weight loss, no fever             Cardiac:                      + frequent chest pain with exertion, occasional chest pain at rest, + SOB with moderate exertion, no resting SOB, no PND, no orthopnea, no palpitations, no arrhythmia, no atrial fibrillation, no LE edema, no dizzy spells, no syncope             Respiratory:                + shortness of breath, no home oxygen, no productive cough, + chronic dry cough, no bronchitis, + wheezing, no hemoptysis, + asthma, no pain with inspiration or cough, + sleep apnea, + CPAP at night             GI:                                NO difficulty swallowing, NO reflux, no frequent heartburn, no hiatal hernia, no abdominal pain, no constipation, no diarrhea, no hematochezia, no hematemesis, no melena             GU:                              no dysuria,  no frequency, no urinary tract infection, no hematuria, no enlarged prostate, + kidney stones, no kidney disease             Vascular:                     + pain suggestive of claudication occurs in both lower legs, no pain in feet, + leg cramps, no varicose veins, no DVT, no non-healing foot ulcer             Neuro:                         no stroke, no TIA's, no seizures, + headaches, no temporary  blindness one eye,  + occasional slurred speech, no peripheral neuropathy, no chronic pain, no instability of gait, + memory/cognitive dysfunction             Musculoskeletal:         no arthritis, no joint swelling, + myalgias, no difficulty  walking, normal mobility               Skin:                            no rash, no itching, no skin infections, no pressure sores or ulcerations             Psych:                         + anxiety, + depression, + nervousness, + unusual recent stress             Eyes:                           no blurry vision, no floaters, no recent vision changes, + wears glasses or contacts             ENT:                            no hearing loss, no loose or painful teeth, no dentures             Hematologic:               no easy bruising, no abnormal bleeding, no clotting disorder, no frequent epistaxis             Endocrine:                    diabetes, checks CBGs routinely at home and reports good control                           Physical Exam:              BP 126/88  Pulse 72  Resp 18  Ht 5\' 5"  (1.651 m)  Wt 261 lb (118.389 kg)  BMI 43.43 kg/m2  SpO2 97%             General:                      Obese but  well-appearing             HEENT:                       Unremarkable               Neck:                           no JVD, no bruits, no adenopathy               Chest:                         clear to auscultation, symmetrical breath sounds, no wheezes, no rhonchi               CV:  RRR, no murmur               Abdomen:                    soft, non-tender, no masses               Extremities:                 warm, well-perfused, pulses palpable, no LE edema, Allen's test normal on left side             Rectal/GU                   Deferred             Neuro:                         Grossly non-focal and symmetrical throughout             Skin:                            Clean and dry, no rashes, no breakdown   Diagnostic Tests:  [facility]   PERCUTANEOUS CORONARY INTERVENTION REPORT   NAME: Trevor Jackson MRN: [id number]   DOB: 1960/07/09 ADMIT DATE: 05/13/2012  INTERVENTIONAL CARDIOLOGIST: Marykay Lex, M.D., MS   PRIMARY  CARE PROVIDER: MABE,DAVID, NP   PRIMARY CARDIOLOGIST:   PATIENT: Trevor Jackson is a 51 y.o. male with a history of coronary disease status post emergency PTCA and PCI to occluded PDA with an inferior solution MI he also had a partial RCA stenting of the time. This was in September 2012. He also existing residual disease in the circumflex is an obese 70% as well as the LAD and a multiple port sites. He's done relatively well since PCI but over the last couple weeks has noted worsening exertional chest discomfort. He's having increasing episodes of this chest discomfort last month and a half with increased fatigue and limited episodes of discomfort every day requiring the use of 1 to sublingual nitroglycerin glycerin each day.   He was referred back to [facility], where he saw Nada Boozer, NP yesterday (05/12/12).Marland Kitchen After initial evaluation by Ms. Annie Paras, I was asked to see the patient in order to determine the further course of action. Was my assessment that he has a least class III crescendo angina symptoms with known existing CAD.   Based upon nature of his symptoms I felt it was best to proceed with diagnostic catheterization plus or minus PCI as he is on beta blocker, nitrate and calcium channel blocker along with Dual Antiplatelet Therapy and is still requiring sublingual nitroglycerin use.   PRE-OPERATIVE DIAGNOSIS:   Crescendo (Class III) Angina     Known CAD PROCEDURES PERFORMED:   Left Heart Catheterization with Native Coronary Angiography via Right Radial Artery Access     Left Ventriculography PROCEDURE:   Consent: Risks of procedure as well as the alternatives and risks of each were explained to the (patient/caregiver). Consent for procedure obtained.   Consent for signed by MD and patient with RN witness -- placed on chart.   PROCEDURE: The patient was brought to the 2nd Floor [facility] in the fasting state and prepped and  draped in the usual sterile fashion for Right Radial access, after a modified Allen's test with plethysmography  demonstrated excellent Ulnar artery collateral flow.. Sterile technique was used including antiseptics, cap, gloves, gown, hand hygiene, mask and sheet. Skin prep: Chlorhexidine.   Time Out: Verified patient identification, verified procedure, site/side was marked, verified correct patient position, special equipment/implants available, medications/allergies/relevent history reviewed, required imaging and test results available. Performed  Access: Right Radial Artery; 5 Fr Sheath   Diagnostic: 5 Fr IG 4.0, Catheter advanced over Long-exchange Safety-J wire into ascending aorta.   Left & Right Coronary Artery Angiography: TIG 4.0     LV Hemodynamics (LV Gram): Angled pigtail. Hemodynamics:   Central Aortic / Mean Pressures: 134/80 mmHg; 96 mmHg;     Left Ventricular Pressures / EDP: 135/12 mmHg; Left Ventriculography:   EF: 55%    Wall Motion: mild basal inferior hypokinesis. Coronary Anatomy:   Left Main: Large caliber vessel that bifurcates into LAD & Circumflex. LAD: Moderate to large caliber vessel with ~40% proximal stenosis, most notable lesion is at the D2 branch point ~70% focal lesion.   D1: Small caliber vessel, diffuse luminal irregularities     D2: small to moderate caliber vessel with ostial ~60% stenosis as part of the bifurcation lesion. Left Circumflex: Large caliber vessel that gives off an early (insiginificant) OM followed by ~80% stenosis just before bifurcating to AVGroove Circumflex & Lateral OM@.   OM 2: Moderate to large caliber vessel, proximal 50-60% stenosis    AVGroove Circumflex Is a moderate caliber vessel that gives off a small LPL branch before terminating as an LPL branch in the posterior AV Groove.   RCA: Large caliber vessel with stent in the mid vessel, ~60% hazy ISR, diffuse luminl irregularities with ~40-50% distal stenosis prior  to bifurcation.     RPDA: Small to moderate caliber vessel with proximal ~80% stenosis.     RPL Sysytem:The Right Posterior AV Groove Branch begins as a small to moderate  TR Band: 1615 Hours, 10 mL air   ANESTHESIA: Local Lidocaine 2 ml   SEDATION: 2 mg IV Versed, 50 mcg IV fentanyl ; Premedication: 5 mg PO Valium  MEDICATIONS: Radial Cocktail: 5 mg Verapamil, 400 mcg NTG, 2 ml 2% Lidocaine in 10 ml NS   IC NTG 200 mcg x 1   EBL: < 5 ml   PATIENT DISPOSITION:   The patient was transferred to the PACU holding area in a hemodynamicaly stable, chest pain free condition.     The patient tolerated the procedure well, and there were no complications.     The patient was stable before, during, and after the procedure. POST-OPERATIVE DIAGNOSIS:   Moderate to severe multivessel CAD involving mid RCA ISR with proximal RPAV & RPDA 80%, LC-OM/AVG Cx bifurcation ~70-80%, LAD-Diag ~70% focal lesion.     Preserved LVEF with normal LVEDP. PLAN OF CARE:   Post Radial care, with plan for OP CT Surgical Consultation to consider CABG.     Hold Effient until seen by CT Sgx     Increase home dose of Effient. Marykay Lex, M.D., M.S.   [facility]   [address]   [place], [place] [address]   [phone]   05/13/2012   5:01 PM        Impression:  Patient is a 49--year-old African American male with history of inferior wall myocardial infarction in 2012 treated with PCI and stenting of the right coronary artery. The patient has now developed crescendo symptoms of angina pectoris despite 3 drug maximal medical therapy. I have  personally reviewed the patient's recent cardiac catheterization which demonstrates severe three-vessel coronary artery disease with preserved left ventricular function. I agree that surgical revascularization would probably be best treatment option at this time.   Plan:  I've discussed the indications, risks, and potential  benefits of coronary artery bypass grafting at length with the patient and his wife here in the office today. Alternative treatment strategies been discussed including continued medical therapy versus multivessel PCI. The relative risks and benefits of all these approaches have been discussed including perspective of the patient's age, comorbid medical conditions, and coronary anatomy.    We plan to proceed with elective coronary artery bypass grafting on Wednesday, December 11.   We will plan to use the left radial artery as well as left internal mammary artery and saphenous vein as conduit for grafting.  All of his questions been addressed.     Trevor Jackson

## 2012-06-06 NOTE — Pre-Procedure Instructions (Signed)
20 Trevor Jackson  06/06/2012   Your procedure is scheduled on:  06/08/12  Report to [facility] at 630 AM.  Call this number if you have problems the morning of surgery: [phone]   Remember:   Do not eat food:After Midnight.      Take these medicines the morning of surgery with A SIP OF WATER: metoprolol,isosorbide   Do not wear jewelry, make-up or nail polish.  Do not wear lotions, powders, or perfumes. You may wear deodorant.  Do not shave 48 hours prior to surgery. Men may shave face and neck.  Do not bring valuables to the hospital.  Contacts, dentures or bridgework may not be worn into surgery.  Leave suitcase in the car. After surgery it may be brought to your room.  For patients admitted to the hospital, checkout time is 11:00 AM the day of discharge.   Patients discharged the day of surgery will not be allowed to drive home.  Name and phone number of your driver: family  Special Instructions: Shower using CHG 2 nights before surgery and the night before surgery.  If you shower the day of surgery use CHG.  Use special wash - you have one bottle of CHG for all showers.  You should use approximately 1/3 of the bottle for each shower.   Please read over the following fact sheets that you were given: Pain Booklet, Coughing and Deep Breathing, Blood Transfusion Information, MRSA Information and Surgical Site Infection Prevention

## 2012-06-06 NOTE — Progress Notes (Signed)
[address]            [place] [address]          [phone]     CARDIOTHORACIC SURGERY OFFICE NOTE  Referring Provider is Marykay Lex, MD PCP is MABE,DAVID, NP   HPI:  Patient returns for followup of severe three-vessel coronary artery disease. He was originally seen in consultation on 05/18/2012. Since then he has remained clinically stable. He reports occasional mild transient substernal chest pain occurring primarily with physical activity. He has not had any prolonged episodes of chest pain nor has he had any chest pain severe enough to want taking sublingual nitroglycerin. He has stable exertional shortness of breath. He reports no new problems and his review of systems otherwise remains unchanged from previously. He is ready to proceed with surgery as scheduled later this week.   Current Outpatient Prescriptions  Medication Sig Dispense Refill  . albuterol (VENTOLIN HFA) 108 (90 BASE) MCG/ACT inhaler Inhale 2 puffs into the lungs every 6 (six) hours as needed. For shortness of breath      . amLODipine (NORVASC) 2.5 MG tablet Take 5 mg by mouth daily.      Marland Kitchen aspirin 81 MG chewable tablet Chew 81 mg by mouth daily.      Marland Kitchen buPROPion (WELLBUTRIN) 100 MG tablet Take 100 mg by mouth 2 (two) times daily.      . Cholecalciferol (VITAMIN D PO) Take 1 tablet by mouth daily.      . Exenatide (BYDUREON) 2 MG SUSR Inject 2 mg into the skin once a week.      . insulin glargine (LANTUS) 100 UNIT/ML injection Inject 20 Units into the skin at bedtime.      . isosorbide mononitrate (IMDUR) 60 MG 24 hr tablet Take 60 mg by mouth daily.      . Linagliptin-Metformin HCl 2.5-500 MG TABS Take 1 tablet by mouth 2 (two) times daily.       Marland Kitchen losartan (COZAAR) 100 MG tablet Take 100 mg by mouth daily.      . metoprolol (LOPRESSOR) 50 MG tablet Take 50 mg by mouth 2 (two) times daily.      . Multiple Vitamins-Minerals (MULTIVITAMINS THER. W/MINERALS) TABS Take 1  tablet by mouth daily.      . niacin (NIASPAN) 500 MG CR tablet Take 500 mg by mouth 2 (two) times daily.       . nitroGLYCERIN (NITROSTAT) 0.4 MG SL tablet Place 0.4 mg under the tongue every 5 (five) minutes as needed. For chest pain      . rosuvastatin (CRESTOR) 40 MG tablet Take 40 mg by mouth 2 (two) times daily.       . sertraline (ZOLOFT) 100 MG tablet Take 100 mg by mouth daily.      Marland Kitchen venlafaxine XR (EFFEXOR XR) 37.5 MG 24 hr capsule Take 37.5 mg by mouth daily.      . vitamin C (ASCORBIC ACID) 500 MG tablet Take 500 mg by mouth daily.      Marland Kitchen zolpidem (AMBIEN) 10 MG tablet Take 10 mg by mouth at bedtime as needed. For sleep          Physical Exam:   BP 131/78  Pulse 72  Resp 18  Ht 5\' 5"  (1.651 m)  Wt 261 lb (118.389 kg)  BMI 43.43 kg/m2  SpO2 93%  General:  Well-appearing  Chest:   Clear to auscultation with symmetrical breath sounds  CV:   Regular rate and rhythm without murmur  Incisions:  n/a  Abdomen:  Soft and nontender  Extremities:  Warm and well-perfused  Diagnostic Tests:  n/a   Impression:  Severe three-vessel coronary artery disease with percent does symptoms of angina pectoris. Left ventricular function is fairly well preserved. Risks of surgery will be slightly increased to do the patient's obesity type 2 diabetes mellitus.  Plan:  We plan to proceed with elective coronary artery bypass grafting on Wednesday, 06/08/2012. We will plan to use left radial artery as conduit for grafting in addition to left internal mammary artery and saphenous vein. I again reviewed the indications, risks, and potential benefits of surgery at length with the patient and his family here in the office today. We spent in excess of 30 minutes and all their questions been addressed.   Salvatore Decent. Cornelius Moras, MD 06/06/2012 12:22 PM

## 2012-06-06 NOTE — Progress Notes (Signed)
Completed heart surgery education with him and his wife.  Answered all questions.  Will continue to follow-up.

## 2012-06-06 NOTE — Progress Notes (Signed)
VASCULAR LAB PRELIMINARY  PRELIMINARY  PRELIMINARY  PRELIMINARY  Pre-op Cardiac Surgery  Carotid Findings:    Upper Extremity Right Left  Brachial Pressures 130 Triphasic Restricted limb Triphasic  Radial Waveforms Triphasic Triphasic  Ulnar Waveforms Biphasic Triphasic  Palmar Arch (Allen's Test) Normal Normal   Findings:  Doppler waveforms remained normal bilaterally with both radial and ulnar compressions    Lower  Extremity Right Left  Dorsalis Pedis 130 Triphasic Restricted limb Triphasic  Anterior Tibial 139 Triphasic 145 Triphasic  Posterior Tibial 162 Triphasic 152 Triphasic  Ankle/Brachial Indices 1.25 1.17    Findings:  ABIs and Doppler waveforms are within normal limits bilaterally at rest   Kaylene Dawn, RVS 06/06/2012, 2:50 PM

## 2012-06-06 NOTE — Progress Notes (Signed)
Lt arm prep added to prep per Darius Bump.

## 2012-06-07 MED ORDER — DOPAMINE-DEXTROSE 3.2-5 MG/ML-% IV SOLN
2.0000 ug/kg/min | INTRAVENOUS | Status: DC
  Filled 2012-06-07: qty 250

## 2012-06-07 MED ORDER — SODIUM CHLORIDE 0.9 % IV SOLN
INTRAVENOUS | Status: AC
  Administered 2012-06-08: 69.8 mL/h via INTRAVENOUS
  Administered 2012-06-08: 13:00:00 via INTRAVENOUS
  Filled 2012-06-07: qty 40

## 2012-06-07 MED ORDER — DEXTROSE 5 % IV SOLN
750.0000 mg | INTRAVENOUS | Status: DC
  Filled 2012-06-07: qty 750

## 2012-06-07 MED ORDER — METOPROLOL TARTRATE 12.5 MG HALF TABLET: 12.5000 mg | ORAL_TABLET | Freq: Once | ORAL | Status: DC

## 2012-06-07 MED ORDER — CEFUROXIME SODIUM 1.5 G IJ SOLR
1.5000 g | INTRAMUSCULAR | Status: AC
  Administered 2012-06-08: 1.5 g via INTRAVENOUS
  Administered 2012-06-08: .75 g via INTRAVENOUS
  Filled 2012-06-07 (×2): qty 1.5

## 2012-06-07 MED ORDER — CHLORHEXIDINE GLUCONATE 4 % EX LIQD: 30.0000 mL | CUTANEOUS | Status: DC

## 2012-06-07 MED ORDER — SODIUM CHLORIDE 0.9 % IV SOLN
INTRAVENOUS | Status: AC
  Administered 2012-06-08: 4.1 [IU]/h via INTRAVENOUS
  Filled 2012-06-07: qty 1

## 2012-06-07 MED ORDER — VANCOMYCIN HCL 10 G IV SOLR
1500.0000 mg | INTRAVENOUS | Status: AC
  Administered 2012-06-08: 1500 mg via INTRAVENOUS
  Filled 2012-06-07 (×2): qty 1500

## 2012-06-07 MED ORDER — EPINEPHRINE HCL 1 MG/ML IJ SOLN
0.5000 ug/min | INTRAVENOUS | Status: DC
  Filled 2012-06-07: qty 4

## 2012-06-07 MED ORDER — POTASSIUM CHLORIDE 2 MEQ/ML IV SOLN
80.0000 meq | INTRAVENOUS | Status: DC
  Filled 2012-06-07: qty 40

## 2012-06-07 MED ORDER — MAGNESIUM SULFATE 50 % IJ SOLN
40.0000 meq | INTRAMUSCULAR | Status: DC
  Filled 2012-06-07: qty 10

## 2012-06-07 MED ORDER — NITROGLYCERIN IN D5W 200-5 MCG/ML-% IV SOLN
2.0000 ug/min | INTRAVENOUS | Status: DC
  Filled 2012-06-07: qty 250

## 2012-06-07 MED ORDER — PHENYLEPHRINE HCL 10 MG/ML IJ SOLN
30.0000 ug/min | INTRAVENOUS | Status: AC
  Administered 2012-06-08: 30 ug/min via INTRAVENOUS
  Filled 2012-06-07: qty 2

## 2012-06-07 MED ORDER — PLASMA-LYTE 148 IV SOLN
INTRAVENOUS | Status: AC
  Administered 2012-06-08: 10:00:00
  Filled 2012-06-07: qty 2.5

## 2012-06-07 MED ORDER — DEXMEDETOMIDINE HCL IN NACL 400 MCG/100ML IV SOLN
0.1000 ug/kg/h | INTRAVENOUS | Status: AC
  Administered 2012-06-08: 15:00:00 via INTRAVENOUS
  Administered 2012-06-08: 0.3 ug/kg/h via INTRAVENOUS
  Filled 2012-06-07: qty 100

## 2012-06-08 ENCOUNTER — Encounter (HOSPITAL_COMMUNITY): Payer: Self-pay | Admitting: *Deleted

## 2012-06-08 ENCOUNTER — Encounter (HOSPITAL_COMMUNITY)
Admission: RE | Disposition: A | Discharge status: Home / Self Care | Payer: Self-pay | Source: Ambulatory Visit | Attending: Thoracic Surgery (Cardiothoracic Vascular Surgery)

## 2012-06-08 ENCOUNTER — Ambulatory Visit (HOSPITAL_COMMUNITY): Payer: BC Managed Care – PPO | Admitting: *Deleted

## 2012-06-08 ENCOUNTER — Inpatient Hospital Stay (HOSPITAL_COMMUNITY): Payer: BC Managed Care – PPO

## 2012-06-08 ENCOUNTER — Encounter (HOSPITAL_COMMUNITY): Payer: Self-pay | Admitting: Thoracic Surgery (Cardiothoracic Vascular Surgery)

## 2012-06-08 ENCOUNTER — Inpatient Hospital Stay (HOSPITAL_COMMUNITY)
Admission: RE | Admit: 2012-06-08 | Discharge: 2012-06-14 | DRG: 109 | Disposition: A | Discharge status: Home / Self Care | Payer: BC Managed Care – PPO | Source: Ambulatory Visit | Attending: Thoracic Surgery (Cardiothoracic Vascular Surgery) | Admitting: Thoracic Surgery (Cardiothoracic Vascular Surgery)

## 2012-06-08 DIAGNOSIS — F341 Dysthymic disorder: Secondary | ICD-10-CM | POA: Diagnosis present

## 2012-06-08 DIAGNOSIS — I2510 Atherosclerotic heart disease of native coronary artery without angina pectoris: Principal | ICD-10-CM | POA: Diagnosis present

## 2012-06-08 DIAGNOSIS — Z6841 Body Mass Index (BMI) 40.0 and over, adult: Secondary | ICD-10-CM

## 2012-06-08 DIAGNOSIS — D62 Acute posthemorrhagic anemia: Secondary | ICD-10-CM | POA: Diagnosis not present

## 2012-06-08 DIAGNOSIS — Z833 Family history of diabetes mellitus: Secondary | ICD-10-CM

## 2012-06-08 DIAGNOSIS — Z9861 Coronary angioplasty status: Secondary | ICD-10-CM

## 2012-06-08 DIAGNOSIS — I10 Essential (primary) hypertension: Secondary | ICD-10-CM | POA: Diagnosis present

## 2012-06-08 DIAGNOSIS — E785 Hyperlipidemia, unspecified: Secondary | ICD-10-CM | POA: Diagnosis present

## 2012-06-08 DIAGNOSIS — Z8249 Family history of ischemic heart disease and other diseases of the circulatory system: Secondary | ICD-10-CM

## 2012-06-08 DIAGNOSIS — I252 Old myocardial infarction: Secondary | ICD-10-CM

## 2012-06-08 DIAGNOSIS — E119 Type 2 diabetes mellitus without complications: Secondary | ICD-10-CM | POA: Diagnosis present

## 2012-06-08 DIAGNOSIS — G4733 Obstructive sleep apnea (adult) (pediatric): Secondary | ICD-10-CM | POA: Diagnosis present

## 2012-06-08 DIAGNOSIS — Z951 Presence of aortocoronary bypass graft: Secondary | ICD-10-CM

## 2012-06-08 HISTORY — DX: Presence of aortocoronary bypass graft: Z95.1

## 2012-06-08 HISTORY — PX: RADIAL ARTERY HARVEST: SHX5067

## 2012-06-08 HISTORY — PX: CORONARY ARTERY BYPASS GRAFT: SHX141

## 2012-06-08 LAB — GLUCOSE, CAPILLARY
Glucose-Capillary: 200 mg/dL — ABNORMAL HIGH (ref 70–99)
Glucose-Capillary: 79 mg/dL (ref 70–99)
Glucose-Capillary: 114 mg/dL — ABNORMAL HIGH (ref 70–99)
Glucose-Capillary: 166 mg/dL — ABNORMAL HIGH (ref 70–99)

## 2012-06-08 LAB — POCT I-STAT 4, (NA,K, GLUC, HGB,HCT)
Glucose, Bld: 197 mg/dL — ABNORMAL HIGH (ref 70–99)
Glucose, Bld: 138 mg/dL — ABNORMAL HIGH (ref 70–99)
Glucose, Bld: 143 mg/dL — ABNORMAL HIGH (ref 70–99)
Glucose, Bld: 103 mg/dL — ABNORMAL HIGH (ref 70–99)
HCT: 39 % (ref 39.0–52.0)
HCT: 30 % — ABNORMAL LOW (ref 39.0–52.0)
HCT: 30 % — ABNORMAL LOW (ref 39.0–52.0)
HCT: 33 % — ABNORMAL LOW (ref 39.0–52.0)
Hemoglobin: 13.3 g/dL (ref 13.0–17.0)
Hemoglobin: 12.9 g/dL — ABNORMAL LOW (ref 13.0–17.0)
Hemoglobin: 10.2 g/dL — ABNORMAL LOW (ref 13.0–17.0)
Hemoglobin: 10.2 g/dL — ABNORMAL LOW (ref 13.0–17.0)
Hemoglobin: 11.2 g/dL — ABNORMAL LOW (ref 13.0–17.0)
Potassium: 4.3 mEq/L (ref 3.5–5.1)
Potassium: 3.4 mEq/L — ABNORMAL LOW (ref 3.5–5.1)
Sodium: 141 mEq/L (ref 135–145)
Sodium: 141 mEq/L (ref 135–145)
Sodium: 140 mEq/L (ref 135–145)
Sodium: 142 mEq/L (ref 135–145)

## 2012-06-08 LAB — POCT I-STAT 3, ART BLOOD GAS (G3+)
Acid-base deficit: 3 mmol/L — ABNORMAL HIGH (ref 0.0–2.0)
Acid-base deficit: 5 mmol/L — ABNORMAL HIGH (ref 0.0–2.0)
Bicarbonate: 24.8 mEq/L — ABNORMAL HIGH (ref 20.0–24.0)
Bicarbonate: 23.3 mEq/L (ref 20.0–24.0)
Bicarbonate: 21.7 mEq/L (ref 20.0–24.0)
Patient temperature: 36
Patient temperature: 37.8
Patient temperature: 37.8
TCO2: 26 mmol/L (ref 0–100)
pCO2 arterial: 41.9 mmHg (ref 35.0–45.0)
pCO2 arterial: 49.7 mmHg — ABNORMAL HIGH (ref 35.0–45.0)
pH, Arterial: 7.303 — ABNORMAL LOW (ref 7.350–7.450)
pH, Arterial: 7.308 — ABNORMAL LOW (ref 7.350–7.450)
pH, Arterial: 7.288 — ABNORMAL LOW (ref 7.350–7.450)
pO2, Arterial: 270 mmHg — ABNORMAL HIGH (ref 80.0–100.0)
pO2, Arterial: 78 mmHg — ABNORMAL LOW (ref 80.0–100.0)
pO2, Arterial: 73 mmHg — ABNORMAL LOW (ref 80.0–100.0)

## 2012-06-08 LAB — PROTIME-INR: INR: 1.35 (ref 0.00–1.49)

## 2012-06-08 LAB — CBC
HCT: 32.5 % — ABNORMAL LOW (ref 39.0–52.0)
Hemoglobin: 10.7 g/dL — ABNORMAL LOW (ref 13.0–17.0)
MCH: 28.3 pg (ref 26.0–34.0)
MCH: 28.2 pg (ref 26.0–34.0)
MCHC: 33.8 g/dL (ref 30.0–36.0)
MCV: 84.4 fL (ref 78.0–100.0)
Platelets: 133 10*3/uL — ABNORMAL LOW (ref 150–400)
RBC: 3.79 MIL/uL — ABNORMAL LOW (ref 4.22–5.81)
RDW: 13.2 % (ref 11.5–15.5)
WBC: 11.6 10*3/uL — ABNORMAL HIGH (ref 4.0–10.5)

## 2012-06-08 LAB — POCT I-STAT, CHEM 8
Calcium, Ion: 1.19 mmol/L (ref 1.12–1.23)
HCT: 34 % — ABNORMAL LOW (ref 39.0–52.0)
Sodium: 141 mEq/L (ref 135–145)
TCO2: 20 mmol/L (ref 0–100)

## 2012-06-08 LAB — CREATININE, SERUM
GFR calc Af Amer: >90 mL/min (ref >90)
GFR calc non Af Amer: >90 mL/min (ref >90)

## 2012-06-08 SURGERY — CORONARY ARTERY BYPASS GRAFTING (CABG)
Anesthesia: General | Site: Chest | Wound class: Clean

## 2012-06-08 MED ORDER — PHENYLEPHRINE HCL 10 MG/ML IJ SOLN
0.0000 ug/min | INTRAVENOUS | Status: DC
  Administered 2012-06-08: 5 ug/min via INTRAVENOUS
  Filled 2012-06-08 (×2): qty 2

## 2012-06-08 MED ORDER — SODIUM CHLORIDE 0.9 % IV SOLN
INTRAVENOUS | Status: DC
  Filled 2012-06-08: qty 1

## 2012-06-08 MED ORDER — ARTIFICIAL TEARS OP OINT
TOPICAL_OINTMENT | OPHTHALMIC | Status: DC | PRN
  Administered 2012-06-08: 1 via OPHTHALMIC

## 2012-06-08 MED ORDER — DEXTROSE 5 % IV SOLN
1.5000 g | Freq: Two times a day (BID) | INTRAVENOUS | Status: DC
  Administered 2012-06-08 – 2012-06-09 (×3): 1.5 g via INTRAVENOUS
  Filled 2012-06-08 (×4): qty 1.5

## 2012-06-08 MED ORDER — MIDAZOLAM HCL 5 MG/5ML IJ SOLN
INTRAMUSCULAR | Status: DC | PRN
  Administered 2012-06-08 (×2): 3 mg via INTRAVENOUS
  Administered 2012-06-08 (×3): 2 mg via INTRAVENOUS

## 2012-06-08 MED ORDER — POTASSIUM CHLORIDE 10 MEQ/50ML IV SOLN
10.0000 meq | INTRAVENOUS | Status: AC
  Administered 2012-06-08 (×3): 10 meq via INTRAVENOUS

## 2012-06-08 MED ORDER — ALBUMIN HUMAN 5 % IV SOLN
12.5000 g | Freq: Once | INTRAVENOUS | Status: AC
  Administered 2012-06-08: 12.5 g via INTRAVENOUS

## 2012-06-08 MED ORDER — INSULIN REGULAR BOLUS VIA INFUSION
0.0000 [IU] | Freq: Three times a day (TID) | INTRAVENOUS | Status: DC
  Filled 2012-06-08: qty 10

## 2012-06-08 MED ORDER — HEMOSTATIC AGENTS (NO CHARGE) OPTIME
TOPICAL | Status: DC | PRN
  Administered 2012-06-08: 1 via TOPICAL

## 2012-06-08 MED ORDER — OXYCODONE HCL 5 MG PO TABS
5.0000 mg | ORAL_TABLET | ORAL | Status: DC | PRN
  Administered 2012-06-09 – 2012-06-14 (×18): 10 mg via ORAL
  Filled 2012-06-08 (×17): qty 2

## 2012-06-08 MED ORDER — 0.9 % SODIUM CHLORIDE (POUR BTL) OPTIME
TOPICAL | Status: DC | PRN
  Administered 2012-06-08: 1000 mL

## 2012-06-08 MED ORDER — MORPHINE SULFATE 2 MG/ML IJ SOLN
1.0000 mg | INTRAMUSCULAR | Status: AC | PRN
  Administered 2012-06-08 (×3): 2 mg via INTRAVENOUS
  Administered 2012-06-08 (×2): 1 mg via INTRAVENOUS
  Filled 2012-06-08 (×3): qty 1

## 2012-06-08 MED ORDER — CALCIUM CHLORIDE 10 % IV SOLN
1.0000 g | Freq: Once | INTRAVENOUS | Status: AC | PRN
  Filled 2012-06-08: qty 10

## 2012-06-08 MED ORDER — VECURONIUM BROMIDE 10 MG IV SOLR
INTRAVENOUS | Status: DC | PRN
  Administered 2012-06-08 (×4): 5 mg via INTRAVENOUS

## 2012-06-08 MED ORDER — METOPROLOL TARTRATE 25 MG/10 ML ORAL SUSPENSION
12.5000 mg | Freq: Two times a day (BID) | ORAL | Status: DC
  Filled 2012-06-08 (×3): qty 5

## 2012-06-08 MED ORDER — GLYCOPYRROLATE 0.2 MG/ML IJ SOLN
INTRAMUSCULAR | Status: DC | PRN
  Administered 2012-06-08: 0.2 mg via INTRAVENOUS

## 2012-06-08 MED ORDER — PANTOPRAZOLE SODIUM 40 MG PO TBEC
40.0000 mg | DELAYED_RELEASE_TABLET | Freq: Every day | ORAL | Status: DC
  Administered 2012-06-10 – 2012-06-14 (×4): 40 mg via ORAL
  Filled 2012-06-08 (×3): qty 1

## 2012-06-08 MED ORDER — ROCURONIUM BROMIDE 100 MG/10ML IV SOLN
INTRAVENOUS | Status: DC | PRN
  Administered 2012-06-08: 30 mg via INTRAVENOUS
  Administered 2012-06-08: 50 mg via INTRAVENOUS

## 2012-06-08 MED ORDER — SODIUM CHLORIDE 0.9 % IV SOLN
0.5000 g/h | Freq: Once | INTRAVENOUS | Status: DC
  Filled 2012-06-08: qty 20

## 2012-06-08 MED ORDER — LACTATED RINGERS IV SOLN
INTRAVENOUS | Status: DC | PRN
  Administered 2012-06-08: 08:00:00 via INTRAVENOUS

## 2012-06-08 MED ORDER — ALBUMIN HUMAN 5 % IV SOLN
250.0000 mL | INTRAVENOUS | Status: AC | PRN
  Administered 2012-06-08 (×2): 250 mL via INTRAVENOUS
  Filled 2012-06-08: qty 500

## 2012-06-08 MED ORDER — HEPARIN SODIUM (PORCINE) 1000 UNIT/ML IJ SOLN
INTRAMUSCULAR | Status: DC | PRN
  Administered 2012-06-08: 3000 [IU] via INTRAVENOUS
  Administered 2012-06-08: 5000 [IU] via INTRAVENOUS
  Administered 2012-06-08: 12000 [IU] via INTRAVENOUS
  Administered 2012-06-08 (×2): 10000 [IU] via INTRAVENOUS

## 2012-06-08 MED ORDER — FAMOTIDINE IN NACL 20-0.9 MG/50ML-% IV SOLN
20.0000 mg | Freq: Two times a day (BID) | INTRAVENOUS | Status: AC
  Administered 2012-06-08: 20 mg via INTRAVENOUS

## 2012-06-08 MED ORDER — METOPROLOL TARTRATE 12.5 MG HALF TABLET
12.5000 mg | ORAL_TABLET | Freq: Two times a day (BID) | ORAL | Status: DC
  Administered 2012-06-09 (×2): 12.5 mg via ORAL
  Filled 2012-06-08 (×5): qty 1

## 2012-06-08 MED ORDER — BUPROPION HCL 100 MG PO TABS
100.0000 mg | ORAL_TABLET | Freq: Two times a day (BID) | ORAL | Status: DC
  Administered 2012-06-09 – 2012-06-13 (×6): 100 mg via ORAL
  Filled 2012-06-08 (×13): qty 1

## 2012-06-08 MED ORDER — ASPIRIN 81 MG PO CHEW: 324.0000 mg | CHEWABLE_TABLET | Freq: Every day | ORAL | Status: DC

## 2012-06-08 MED ORDER — INSULIN ASPART 100 UNIT/ML ~~LOC~~ SOLN
0.0000 [IU] | SUBCUTANEOUS | Status: DC
  Administered 2012-06-09 (×2): 2 [IU] via SUBCUTANEOUS

## 2012-06-08 MED ORDER — SODIUM CHLORIDE 0.9 % IJ SOLN
3.0000 mL | Freq: Two times a day (BID) | INTRAMUSCULAR | Status: DC
  Administered 2012-06-09 (×2): 3 mL via INTRAVENOUS

## 2012-06-08 MED ORDER — MAGNESIUM SULFATE 40 MG/ML IJ SOLN
4.0000 g | Freq: Once | INTRAMUSCULAR | Status: AC
  Administered 2012-06-08: 4 g via INTRAVENOUS
  Filled 2012-06-08: qty 100

## 2012-06-08 MED ORDER — ACETAMINOPHEN 500 MG PO TABS
1000.0000 mg | ORAL_TABLET | Freq: Four times a day (QID) | ORAL | Status: AC
  Administered 2012-06-09 – 2012-06-11 (×13): 1000 mg via ORAL
  Administered 2012-06-12: 500 mg via ORAL
  Administered 2012-06-12: 1000 mg via ORAL
  Administered 2012-06-12: 500 mg via ORAL
  Administered 2012-06-12: 1000 mg via ORAL
  Administered 2012-06-13: 500 mg via ORAL
  Administered 2012-06-13 (×2): 1000 mg via ORAL
  Filled 2012-06-08 (×18): qty 2

## 2012-06-08 MED ORDER — VENLAFAXINE HCL ER 37.5 MG PO CP24
37.5000 mg | ORAL_CAPSULE | Freq: Every day | ORAL | Status: DC
  Administered 2012-06-09 – 2012-06-14 (×6): 37.5 mg via ORAL
  Filled 2012-06-08 (×7): qty 1

## 2012-06-08 MED ORDER — SODIUM CHLORIDE 0.9 % IJ SOLN: 3.0000 mL | INTRAMUSCULAR | Status: DC | PRN

## 2012-06-08 MED ORDER — NITROGLYCERIN IN D5W 200-5 MCG/ML-% IV SOLN
0.0000 ug/min | INTRAVENOUS | Status: DC
  Administered 2012-06-08: 7 ug/min via INTRAVENOUS

## 2012-06-08 MED ORDER — ALBUMIN HUMAN 5 % IV SOLN
INTRAVENOUS | Status: DC | PRN
  Administered 2012-06-08: 13:00:00 via INTRAVENOUS

## 2012-06-08 MED ORDER — SODIUM BICARBONATE 8.4 % IV SOLN
50.0000 meq | Freq: Once | INTRAVENOUS | Status: AC
  Administered 2012-06-08: 50 meq via INTRAVENOUS
  Filled 2012-06-08: qty 50

## 2012-06-08 MED ORDER — VANCOMYCIN HCL IN DEXTROSE 1-5 GM/200ML-% IV SOLN
1000.0000 mg | Freq: Once | INTRAVENOUS | Status: AC
  Administered 2012-06-08: 1000 mg via INTRAVENOUS
  Filled 2012-06-08: qty 200

## 2012-06-08 MED ORDER — METOPROLOL TARTRATE 1 MG/ML IV SOLN: 2.5000 mg | INTRAVENOUS | Status: DC | PRN

## 2012-06-08 MED ORDER — BISACODYL 10 MG RE SUPP: 10.0000 mg | Freq: Every day | RECTAL | Status: DC

## 2012-06-08 MED ORDER — PROTAMINE SULFATE 10 MG/ML IV SOLN
INTRAVENOUS | Status: DC | PRN
  Administered 2012-06-08: 50 mg via INTRAVENOUS
  Administered 2012-06-08: 100 mg via INTRAVENOUS
  Administered 2012-06-08 (×3): 50 mg via INTRAVENOUS

## 2012-06-08 MED ORDER — PROPOFOL 10 MG/ML IV BOLUS
INTRAVENOUS | Status: DC | PRN
  Administered 2012-06-08: 170 mg via INTRAVENOUS

## 2012-06-08 MED ORDER — SODIUM CHLORIDE 0.45 % IV SOLN
INTRAVENOUS | Status: DC
  Administered 2012-06-08: 20 mL/h via INTRAVENOUS

## 2012-06-08 MED ORDER — DEXMEDETOMIDINE HCL IN NACL 400 MCG/100ML IV SOLN
0.1000 ug/kg/h | INTRAVENOUS | Status: DC
  Administered 2012-06-08: 0.7 ug/kg/h via INTRAVENOUS
  Filled 2012-06-08 (×2): qty 100

## 2012-06-08 MED ORDER — DEXMEDETOMIDINE HCL IN NACL 400 MCG/100ML IV SOLN
0.4000 ug/kg/h | INTRAVENOUS | Status: DC
  Filled 2012-06-08: qty 100

## 2012-06-08 MED ORDER — ACETAMINOPHEN 160 MG/5ML PO SOLN: 975.0000 mg | Freq: Four times a day (QID) | ORAL | Status: DC

## 2012-06-08 MED ORDER — POTASSIUM CHLORIDE 10 MEQ/50ML IV SOLN
10.0000 meq | INTRAVENOUS | Status: DC | PRN
  Administered 2012-06-08 (×2): 10 meq via INTRAVENOUS

## 2012-06-08 MED ORDER — LACTATED RINGERS IV SOLN
INTRAVENOUS | Status: DC
  Administered 2012-06-08: 20 mL/h via INTRAVENOUS

## 2012-06-08 MED ORDER — SERTRALINE HCL 100 MG PO TABS
100.0000 mg | ORAL_TABLET | Freq: Every day | ORAL | Status: DC
  Administered 2012-06-09 – 2012-06-11 (×3): 100 mg via ORAL
  Filled 2012-06-08 (×7): qty 1

## 2012-06-08 MED ORDER — SODIUM CHLORIDE 0.9 % IV SOLN: 250.0000 mL | INTRAVENOUS | Status: DC

## 2012-06-08 MED ORDER — LACTATED RINGERS IV SOLN
INTRAVENOUS | Status: DC | PRN
  Administered 2012-06-08 (×2): via INTRAVENOUS

## 2012-06-08 MED ORDER — SODIUM CHLORIDE 0.9 % IJ SOLN
OROMUCOSAL | Status: DC | PRN
  Administered 2012-06-08: 10:00:00 via TOPICAL

## 2012-06-08 MED ORDER — BISACODYL 5 MG PO TBEC
10.0000 mg | DELAYED_RELEASE_TABLET | Freq: Every day | ORAL | Status: DC
  Administered 2012-06-09 – 2012-06-11 (×3): 10 mg via ORAL
  Filled 2012-06-08 (×4): qty 2

## 2012-06-08 MED ORDER — ASPIRIN EC 325 MG PO TBEC
325.0000 mg | DELAYED_RELEASE_TABLET | Freq: Every day | ORAL | Status: DC
  Administered 2012-06-09 – 2012-06-14 (×6): 325 mg via ORAL
  Filled 2012-06-08 (×6): qty 1

## 2012-06-08 MED ORDER — MIDAZOLAM HCL 2 MG/2ML IJ SOLN
2.0000 mg | INTRAMUSCULAR | Status: DC | PRN
  Filled 2012-06-08: qty 2
  Filled 2012-06-08: qty 4

## 2012-06-08 MED ORDER — MORPHINE SULFATE 2 MG/ML IJ SOLN
2.0000 mg | INTRAMUSCULAR | Status: DC | PRN
  Administered 2012-06-09 (×5): 4 mg via INTRAVENOUS
  Filled 2012-06-08: qty 1
  Filled 2012-06-08 (×5): qty 2

## 2012-06-08 MED ORDER — ACETAMINOPHEN 10 MG/ML IV SOLN
1000.0000 mg | Freq: Once | INTRAVENOUS | Status: AC
  Administered 2012-06-08: 1000 mg via INTRAVENOUS
  Filled 2012-06-08: qty 100

## 2012-06-08 MED ORDER — FENTANYL CITRATE 0.05 MG/ML IJ SOLN
INTRAMUSCULAR | Status: DC | PRN
  Administered 2012-06-08 (×2): 150 ug via INTRAVENOUS
  Administered 2012-06-08: 50 ug via INTRAVENOUS
  Administered 2012-06-08: 200 ug via INTRAVENOUS
  Administered 2012-06-08 (×2): 250 ug via INTRAVENOUS
  Administered 2012-06-08: 50 ug via INTRAVENOUS
  Administered 2012-06-08: 250 ug via INTRAVENOUS
  Administered 2012-06-08 (×2): 50 ug via INTRAVENOUS
  Administered 2012-06-08: 250 ug via INTRAVENOUS

## 2012-06-08 MED ORDER — PHENYLEPHRINE HCL 10 MG/ML IJ SOLN
INTRAMUSCULAR | Status: DC | PRN
  Administered 2012-06-08: 80 ug via INTRAVENOUS
  Administered 2012-06-08: 40 ug via INTRAVENOUS
  Administered 2012-06-08: 80 ug via INTRAVENOUS

## 2012-06-08 MED ORDER — INSULIN ASPART 100 UNIT/ML ~~LOC~~ SOLN
0.0000 [IU] | SUBCUTANEOUS | Status: AC
  Administered 2012-06-08 (×2): 4 [IU] via SUBCUTANEOUS

## 2012-06-08 MED ORDER — DOCUSATE SODIUM 100 MG PO CAPS
200.0000 mg | ORAL_CAPSULE | Freq: Every day | ORAL | Status: DC
  Administered 2012-06-09 – 2012-06-11 (×3): 200 mg via ORAL
  Filled 2012-06-08 (×6): qty 2

## 2012-06-08 MED ORDER — ONDANSETRON HCL 4 MG/2ML IJ SOLN
4.0000 mg | Freq: Four times a day (QID) | INTRAMUSCULAR | Status: DC | PRN
  Administered 2012-06-09 – 2012-06-10 (×3): 4 mg via INTRAVENOUS
  Filled 2012-06-08 (×3): qty 2

## 2012-06-08 MED ORDER — SODIUM CHLORIDE 0.9 % IV SOLN
INTRAVENOUS | Status: DC
  Administered 2012-06-08: 20 mL/h via INTRAVENOUS

## 2012-06-08 SURGICAL SUPPLY — 134 items
ADAPTER CARDIO PERF ANTE/RETRO (ADAPTER) IMPLANT
ADPR PRFSN 84XANTGRD RTRGD (ADAPTER)
APL SKNCLS STERI-STRIP NONHPOA (GAUZE/BANDAGES/DRESSINGS) ×2
APPLIER CLIP 9.375 MED OPEN (MISCELLANEOUS)
APPLIER CLIP 9.375 SM OPEN (CLIP)
APR CLP MED 9.3 20 MLT OPN (MISCELLANEOUS)
APR CLP SM 9.3 20 MLT OPN (CLIP)
ATTRACTOMAT 16X20 MAGNETIC DRP (DRAPES) ×3 IMPLANT
BAG DECANTER FOR FLEXI CONT (MISCELLANEOUS) ×3 IMPLANT
BANDAGE ELASTIC 4 VELCRO ST LF (GAUZE/BANDAGES/DRESSINGS) ×4 IMPLANT
BANDAGE ELASTIC 6 VELCRO ST LF (GAUZE/BANDAGES/DRESSINGS) ×3 IMPLANT
BANDAGE GAUZE ELAST BULKY 4 IN (GAUZE/BANDAGES/DRESSINGS) ×4 IMPLANT
BASKET HEART (ORDER IN 25'S) (MISCELLANEOUS) ×1
BASKET HEART (ORDER IN 25S) (MISCELLANEOUS) ×2 IMPLANT
BENZOIN TINCTURE PRP APPL 2/3 (GAUZE/BANDAGES/DRESSINGS) ×3 IMPLANT
BLADE STERNUM SYSTEM 6 (BLADE) ×3 IMPLANT
BLADE SURG 15 STRL LF DISP TIS (BLADE) IMPLANT
BLADE SURG 15 STRL SS (BLADE) ×3
BLADE SURG ROTATE 9660 (MISCELLANEOUS) ×1 IMPLANT
CANISTER SUCTION 2500CC (MISCELLANEOUS) ×3 IMPLANT
CANNULA GUNDRY RCSP 15FR (MISCELLANEOUS) IMPLANT
CATH CPB KIT OWEN (MISCELLANEOUS) ×3 IMPLANT
CATH THORACIC 28FR (CATHETERS) IMPLANT
CATH THORACIC 28FR RT ANG (CATHETERS) IMPLANT
CATH THORACIC 36FR (CATHETERS) ×3 IMPLANT
CATH THORACIC 36FR RT ANG (CATHETERS) ×3 IMPLANT
CLIP APPLIE 9.375 MED OPEN (MISCELLANEOUS) IMPLANT
CLIP APPLIE 9.375 SM OPEN (CLIP) IMPLANT
CLIP FOGARTY SPRING 6M (CLIP) IMPLANT
CLIP RETRACTION 3.0MM CORONARY (MISCELLANEOUS) ×1 IMPLANT
CLIP TI MEDIUM 24 (CLIP) IMPLANT
CLIP TI WIDE RED SMALL 24 (CLIP) IMPLANT
CLOTH BEACON ORANGE TIMEOUT ST (SAFETY) ×3 IMPLANT
CLSR STERI-STRIP ANTIMIC 1/2X4 (GAUZE/BANDAGES/DRESSINGS) ×1 IMPLANT
CONN Y 3/8X3/8X3/8  BEN (MISCELLANEOUS)
CONN Y 3/8X3/8X3/8 BEN (MISCELLANEOUS) IMPLANT
COVER MAYO STAND STRL (DRAPES) ×1 IMPLANT
COVER SURGICAL LIGHT HANDLE (MISCELLANEOUS) ×3 IMPLANT
CRADLE DONUT ADULT HEAD (MISCELLANEOUS) ×3 IMPLANT
DRAIN CHANNEL 32F RND 10.7 FF (WOUND CARE) ×3 IMPLANT
DRAPE CARDIOVASCULAR INCISE (DRAPES) ×3
DRAPE EXTREMITY T 121X128X90 (DRAPE) ×1 IMPLANT
DRAPE INCISE IOBAN 66X45 STRL (DRAPES) ×1 IMPLANT
DRAPE PROXIMA HALF (DRAPES) ×1 IMPLANT
DRAPE SLUSH/WARMER DISC (DRAPES) ×3 IMPLANT
DRAPE SRG 135X102X78XABS (DRAPES) ×2 IMPLANT
DRSG COVADERM 4X14 (GAUZE/BANDAGES/DRESSINGS) ×3 IMPLANT
ELECT BLADE 4.0 EZ CLEAN MEGAD (MISCELLANEOUS) ×3
ELECT REM PT RETURN 9FT ADLT (ELECTROSURGICAL) ×6
ELECTRODE BLDE 4.0 EZ CLN MEGD (MISCELLANEOUS) IMPLANT
ELECTRODE REM PT RTRN 9FT ADLT (ELECTROSURGICAL) ×4 IMPLANT
GEL ULTRASOUND 20GR AQUASONIC (MISCELLANEOUS) ×1 IMPLANT
GLOVE BIO SURGEON STRL SZ 6 (GLOVE) ×5 IMPLANT
GLOVE BIO SURGEON STRL SZ 6.5 (GLOVE) IMPLANT
GLOVE BIO SURGEON STRL SZ7 (GLOVE) IMPLANT
GLOVE BIO SURGEON STRL SZ7.5 (GLOVE) ×2 IMPLANT
GLOVE BIOGEL PI IND STRL 6 (GLOVE) IMPLANT
GLOVE BIOGEL PI IND STRL 6.5 (GLOVE) IMPLANT
GLOVE BIOGEL PI IND STRL 7.0 (GLOVE) IMPLANT
GLOVE BIOGEL PI INDICATOR 6 (GLOVE) ×3
GLOVE BIOGEL PI INDICATOR 6.5 (GLOVE) ×1
GLOVE BIOGEL PI INDICATOR 7.0 (GLOVE) ×3
GLOVE EUDERMIC 7 POWDERFREE (GLOVE) IMPLANT
GLOVE ORTHO TXT STRL SZ7.5 (GLOVE) ×7 IMPLANT
GOWN PREVENTION PLUS XLARGE (GOWN DISPOSABLE) ×1 IMPLANT
GOWN STRL NON-REIN LRG LVL3 (GOWN DISPOSABLE) ×14 IMPLANT
HARMONIC SHEARS 14CM COAG (MISCELLANEOUS) ×3 IMPLANT
HEMOSTAT POWDER SURGIFOAM 1G (HEMOSTASIS) ×9 IMPLANT
INSERT FOGARTY 61MM (MISCELLANEOUS) IMPLANT
INSERT FOGARTY XLG (MISCELLANEOUS) ×3 IMPLANT
KIT BASIN OR (CUSTOM PROCEDURE TRAY) ×3 IMPLANT
KIT ROOM TURNOVER OR (KITS) ×3 IMPLANT
KIT SUCTION CATH 14FR (SUCTIONS) ×13 IMPLANT
KIT VASOVIEW 6 PRO VH 2400 (KITS) ×1 IMPLANT
KIT VASOVIEW W/TROCAR VH 2000 (KITS) ×3 IMPLANT
LEAD PACING MYOCARDI (MISCELLANEOUS) ×3 IMPLANT
LINE EXTENSION DELIVERY (MISCELLANEOUS) ×1 IMPLANT
MARKER GRAFT CORONARY BYPASS (MISCELLANEOUS) ×9 IMPLANT
NS IRRIG 1000ML POUR BTL (IV SOLUTION) ×16 IMPLANT
PACK OPEN HEART (CUSTOM PROCEDURE TRAY) ×3 IMPLANT
PAD ARMBOARD 7.5X6 YLW CONV (MISCELLANEOUS) ×3 IMPLANT
PENCIL BUTTON HOLSTER BLD 10FT (ELECTRODE) ×4 IMPLANT
PUNCH AORTIC ROTATE 4.0MM (MISCELLANEOUS) IMPLANT
PUNCH AORTIC ROTATE 4.5MM 8IN (MISCELLANEOUS) IMPLANT
PUNCH AORTIC ROTATE 5MM 8IN (MISCELLANEOUS) IMPLANT
SET CARDIOPLEGIA MPS 5001102 (MISCELLANEOUS) ×2 IMPLANT
SOLUTION ANTI FOG 6CC (MISCELLANEOUS) IMPLANT
SPONGE GAUZE 4X4 12PLY (GAUZE/BANDAGES/DRESSINGS) ×7 IMPLANT
SPONGE LAP 18X18 X RAY DECT (DISPOSABLE) ×2 IMPLANT
SPONGE LAP 4X18 X RAY DECT (DISPOSABLE) ×1 IMPLANT
SUT BONE WAX W31G (SUTURE) ×3 IMPLANT
SUT ETHIBOND X763 2 0 SH 1 (SUTURE) ×6 IMPLANT
SUT MNCRL AB 3-0 PS2 18 (SUTURE) ×6 IMPLANT
SUT MNCRL AB 4-0 PS2 18 (SUTURE) ×1 IMPLANT
SUT PDS AB 1 CTX 36 (SUTURE) ×6 IMPLANT
SUT PROLENE 2 0 SH DA (SUTURE) IMPLANT
SUT PROLENE 3 0 SH DA (SUTURE) ×3 IMPLANT
SUT PROLENE 3 0 SH1 36 (SUTURE) IMPLANT
SUT PROLENE 4 0 RB 1 (SUTURE)
SUT PROLENE 4 0 SH DA (SUTURE) IMPLANT
SUT PROLENE 4-0 RB1 .5 CRCL 36 (SUTURE) IMPLANT
SUT PROLENE 5 0 C 1 36 (SUTURE) IMPLANT
SUT PROLENE 6 0 C 1 30 (SUTURE) ×5 IMPLANT
SUT PROLENE 7.0 RB 3 (SUTURE) ×12 IMPLANT
SUT PROLENE 8 0 BV175 6 (SUTURE) ×3 IMPLANT
SUT PROLENE BLUE 7 0 (SUTURE) ×3 IMPLANT
SUT PROLENE POLY MONO (SUTURE) IMPLANT
SUT SILK  1 MH (SUTURE) ×1
SUT SILK 1 MH (SUTURE) ×2 IMPLANT
SUT STEEL 6MS V (SUTURE) ×1 IMPLANT
SUT STEEL STERNAL CCS#1 18IN (SUTURE) IMPLANT
SUT STEEL SZ 6 DBL 3X14 BALL (SUTURE) ×3 IMPLANT
SUT VIC AB 1 CTX 36 (SUTURE)
SUT VIC AB 1 CTX36XBRD ANBCTR (SUTURE) IMPLANT
SUT VIC AB 2-0 CT1 27 (SUTURE) ×6
SUT VIC AB 2-0 CT1 TAPERPNT 27 (SUTURE) IMPLANT
SUT VIC AB 2-0 CTX 27 (SUTURE) ×1 IMPLANT
SUT VIC AB 3-0 SH 27 (SUTURE) ×3
SUT VIC AB 3-0 SH 27X BRD (SUTURE) IMPLANT
SUT VIC AB 3-0 X1 27 (SUTURE) ×1 IMPLANT
SUT VICRYL 4-0 PS2 18IN ABS (SUTURE) IMPLANT
SUTURE E-PAK OPEN HEART (SUTURE) ×3 IMPLANT
SYR 50ML SLIP (SYRINGE) IMPLANT
SYSTEM SAHARA CHEST DRAIN ATS (WOUND CARE) ×3 IMPLANT
TABLE PACK (MISCELLANEOUS) ×1 IMPLANT
TAPE CLOTH SURG 4X10 WHT LF (GAUZE/BANDAGES/DRESSINGS) ×1 IMPLANT
TAPE PAPER 2X10 WHT MICROPORE (GAUZE/BANDAGES/DRESSINGS) ×1 IMPLANT
TOWEL OR 17X24 6PK STRL BLUE (TOWEL DISPOSABLE) ×6 IMPLANT
TOWEL OR 17X26 10 PK STRL BLUE (TOWEL DISPOSABLE) ×6 IMPLANT
TRAY FOLEY IC TEMP SENS 14FR (CATHETERS) ×3 IMPLANT
TUBE SUCT INTRACARD DLP 20F (MISCELLANEOUS) ×3 IMPLANT
TUBING INSUFFLATION 10FT LAP (TUBING) ×3 IMPLANT
UNDERPAD 30X30 INCONTINENT (UNDERPADS AND DIAPERS) ×4 IMPLANT
WATER STERILE IRR 1000ML POUR (IV SOLUTION) ×6 IMPLANT

## 2012-06-08 NOTE — Progress Notes (Signed)
  Echocardiogram Echocardiogram Transesophageal has been performed.  Trevor Jackson 06/08/2012, 9:21 AM

## 2012-06-08 NOTE — Op Note (Signed)
CARDIOTHORACIC SURGERY OPERATIVE NOTE  Date of Procedure: 06/08/2012  Preoperative Diagnosis: Severe 3-vessel Coronary Artery Disease  Postoperative Diagnosis: Same  Procedure:   Coronary Artery Bypass Grafting x 3  Left Internal Mammary Artery to Distal Left Anterior Descending Coronary Artery Left Radial Artery to Obtuse Marginal Branch of Left Circumflex Coronary Saphenous Vein Graft to Posterior Descending Coronary Artery Endoscopic Vein Harvest from Thigh and Lower Leg  Surgeon: Salvatore Decent. Cornelius Moras, MD  Assistant: Gershon Crane, PA-C and Coral Ceo, PA-C  Anesthesia: Achille Rich, MD  Operative Findings:  Normal LV systolic function   Good quality LIMA, LRA and SVG conduit for grafting   Fair to good quality target vessels for grafting     BRIEF CLINICAL NOTE AND INDICATIONS FOR SURGERY  Patient is a 51 year old obese African American male from [place] with known history of coronary artery disease status post acute inferior wall myocardial infarction in July of 2012. The patient was treated with PCI and stenting of the right coronary artery using a drug-eluting stent at that time. Other comorbid medical problems include hypertension, type 2 diabetes mellitus, obesity, hyperlipidemia, obstructive sleep apnea, and a strong family history of coronary artery disease. The patient states that he did fairly well initially after he recovered from his myocardial infarction in 2012. However, he since then has been developed recurrent angina pectoris which has accelerated considerably over the last 2 months. The patient describes classical symptoms of substernal chest tightness brought on with physical activity and relieved by rest or sublingual nitroglycerin. These symptoms currently are her nearly every day or brought on with mild activity and occasionally occurring at rest despite the fact that the patient is currently on maximal 3-drug  medical therapy. Symptoms are always promptly  relieved with administration of sublingual nitroglycerin. Chest tightness is associated with shortness breath and diaphoresis. The patient describes chronic exertional shortness of breath otherwise which has not changed substantially over time. Patient denies resting shortness of breath, PND, orthopnea, lower extremity edema, palpitations, or syncope.  The patient was seen in followup at the [facility] heart and vascular center last week and probably scheduled for cardiac catheterization that was performed by Dr. Herbie Baltimore. This demonstrates severe three-vessel coronary artery disease with preserved left ventricular function. The patient has been seen in consultation and counseled at length regarding the indications, risks and potential benefits of surgery.  All questions have been answered, and the patient provides full informed consent for the operation as described.    DETAILS OF THE OPERATIVE PROCEDURE  The patient is brought to the operating room on the above mentioned date and central monitoring was established by the anesthesia team including placement of Swan-Ganz catheter and radial arterial line. The patient is placed in the supine position on the operating table.  Intravenous antibiotics are administered. General endotracheal anesthesia is induced uneventfully. A Foley catheter is placed.  Baseline transesophageal echocardiogram was performed.  Findings were notable for normal left ventricular size and systolic function.  The patient's chest, left arm, abdomen, both groins, and both lower extremities are prepared and draped in a sterile manner. A time out procedure is performed.  The left radial artery was removed from the left forearm through a longitudinal incision along the volar aspect of the left forearm.  All intervening sidebranches of the artery and its veins were divided using the harmonic scalpel.  After the artery was removed the forearm was closed in layers with absorbable sutures.   A median sternotomy incision was performed and the left  internal mammary artery is dissected from the chest wall and prepared for bypass grafting. The left internal mammary artery is notably good quality conduit. Simultaneously, saphenous vein is obtained from the patient's right thigh using endoscopic vein harvest technique. The saphenous vein is notably good quality conduit. After removal of the saphenous vein, the small surgical incisions in the lower extremity are closed with absorbable suture. Following systemic heparinization, the left internal mammary artery was transected distally noted to have excellent flow.  The pericardium is opened. The ascending aorta is in appearance. The ascending aorta and the right atrium are cannulated for cardioplegia bypass.  Adequate heparinization is verified.    The entire pre-bypass portion of the operation was notable for stable hemodynamics.  Cardiopulmonary bypass was begun and the surface of the heart is inspected. Distal target vessels are selected for coronary artery bypass grafting. A cardioplegia cannula is placed in the ascending aorta.  A temperature probe was placed in the interventricular septum.  The patient is allowed to cool passively to [facility] systemic temperature.  The aortic cross clamp is applied and cold blood cardioplegia is delivered initially in an antegrade fashion through the aortic root.    Iced saline slush is applied for topical hypothermia.  The initial cardioplegic arrest is rapid with early diastolic arrest.  Repeat doses of cardioplegia are administered intermittently throughout the entire cross clamp portion of the operation through the aortic root and through subsequently placed vein grafts in order to maintain completely flat electrocardiogram and septal myocardial temperature below 15C.  Myocardial protection was felt to be excellent.  The following distal coronary artery bypass grafts were performed:   The posterior  descending branch of the right coronary artery was grafted using a reversed saphenous vein graft in an end-to-side fashion.  At the site of distal anastomosis the target vessel was fair quality and measured approximately 1.5 mm in diameter.  The obtuse marginal branch of the left circumflex coronary artery was grafted using the left radial artery in an end-to-side fashion.  At the site of distal anastomosis the target vessel was good quality and measured approximately 2.0 mm in diameter.  The distal left anterior coronary artery was grafted with the left internal mammary artery in an end-to-side fashion.  At the site of distal anastomosis the target vessel was good quality and measured approximately 2.0 mm in diameter.   The proximal vein graft and radial artery graft anastomoses were placed directly to the ascending aorta prior to removal of the aortic cross clamp.  The septal myocardial temperature rose rapidly after reperfusion of the left internal mammary artery graft.  The aortic cross clamp was removed after a total cross clamp time of 79 minutes.  All proximal and distal coronary anastomoses were inspected for hemostasis and appropriate graft orientation. Epicardial pacing wires are fixed to the right ventricular outflow tract and to the right atrial appendage. The patient is rewarmed to 37C temperature. The patient is weaned and disconnected from cardiopulmonary bypass.  The patient's rhythm at separation from bypass was sinus.  The patient was weaned from cardioplegic bypass without any inotropic support. Total cardiopulmonary bypass time for the operation was 109 minutes.  Followup transesophageal echocardiogram performed after separation from bypass revealed no changes from the preoperative exam.  The aortic and venous cannula were removed uneventfully. Protamine was administered to reverse the anticoagulation. The mediastinum and pleural space were inspected for hemostasis and irrigated  with saline solution. The mediastinum and the left pleural space were drained  using 3 chest tubes placed through separate stab incisions inferiorly.  The soft tissues anterior to the aorta were reapproximated loosely. The sternum is closed with double strength sternal wire. The soft tissues anterior to the sternum were closed in multiple layers and the skin is closed with a running subcuticular skin closure.  The post-bypass portion of the operation was notable for stable rhythm and hemodynamics.  No blood products were administered during the operation.  The patient tolerated the procedure well and is transported to the surgical intensive care in stable condition. There are no intraoperative complications. All sponge instrument and needle counts are verified correct at completion of the operation.    Salvatore Decent. Cornelius Moras MD 06/08/2012 2:26 PM

## 2012-06-08 NOTE — Progress Notes (Signed)
Patient ID: Trevor Jackson, male   DOB: 1960-08-05, 51 y.o.   MRN: [id number]  Hemodynamically stable on low dose neo and ntg.  CI = 1.8  Urine output ok  CT output low  Waking up   CBC    Component Value Date/Time   WBC 10.7* 06/08/2012 1452   RBC 3.89* 06/08/2012 1452   HGB 11.2* 06/08/2012 1455   HCT 33.0* 06/08/2012 1455   PLT 117* 06/08/2012 1452   MCV 83.5 06/08/2012 1452   MCH 28.3 06/08/2012 1452   MCHC 33.8 06/08/2012 1452   RDW 13.2 06/08/2012 1452    BMET    Component Value Date/Time   NA 143 06/08/2012 1455   K 3.4* 06/08/2012 1455   CL 103 06/06/2012 1326   CO2 23 06/06/2012 1326   GLUCOSE 103* 06/08/2012 1455   BUN 14 06/06/2012 1326   CREATININE 0.72 06/06/2012 1326   CALCIUM 9.8 06/06/2012 1326   GFRNONAA >90 06/06/2012 1326   GFRAA >90 06/06/2012 1326    A/P: stable. Giving some volume since on vasopressor. Continue current course.

## 2012-06-08 NOTE — Preoperative (Signed)
Beta Blockers   Reason not to administer Beta Blockers:Not Applicable, took Lopressor this AM

## 2012-06-08 NOTE — Brief Op Note (Addendum)
                   [address]            [place] [address]          [phone]    06/08/2012  12:30 PM  PATIENT:  Trevor Jackson  51 y.o. male  PRE-OPERATIVE DIAGNOSIS:  CAD  POST-OPERATIVE DIAGNOSIS:  CAD  PROCEDURE:  Procedure(s): CORONARY ARTERY BYPASS GRAFTING (CABG)X3 LIMA-LAD; LRADIAL-OM; SVG-PDA RADIAL ARTERY HARVEST EVH RIGHT THIGH  SURGEON:    Purcell Nails, MD  ASSISTANTS:  Gershon Crane, PA-C  ANESTHESIA:   Raiford Simmonds, MD  CROSSCLAMP TIME:   65'  CARDIOPULMONARY BYPASS TIME: 109'  FINDINGS:  Normal LV systolic function  Good quality LIMA, LRA and SVG conduit for grafting  Fair to good quality target vessels for grafting  COMPLICATIONS: none  PATIENT DISPOSITION:   TO SICU IN STABLE CONDITION  [name] H 06/08/2012 2:22 PM

## 2012-06-08 NOTE — Anesthesia Preprocedure Evaluation (Signed)
Anesthesia Evaluation  Patient identified by MRN, date of birth, ID band Patient awake    Reviewed: Allergy & Precautions, H&P , NPO status , Patient's Chart, lab work & pertinent test results  Airway Mallampati: III  Neck ROM: full    Dental   Pulmonary shortness of breath, sleep apnea ,          Cardiovascular hypertension, + angina + CAD, + Past MI and + Cardiac Stents     Neuro/Psych  Headaches, Anxiety    GI/Hepatic   Endo/Other  diabetes, Type obesity  Renal/GU      Musculoskeletal   Abdominal   Peds  Hematology   Anesthesia Other Findings   Reproductive/Obstetrics                           Anesthesia Physical Anesthesia Plan  ASA: III  Anesthesia Plan: General   Post-op Pain Management:    Induction: Intravenous  Airway Management Planned: Oral ETT  Additional Equipment: Arterial line, CVP, PA Cath and TEE  Intra-op Plan:   Post-operative Plan: Post-operative intubation/ventilation  Informed Consent: I have reviewed the patients History and Physical, chart, labs and discussed the procedure including the risks, benefits and alternatives for the proposed anesthesia with the patient or authorized representative who has indicated his/her understanding and acceptance.     Plan Discussed with: CRNA and Surgeon  Anesthesia Plan Comments:         Anesthesia Quick Evaluation

## 2012-06-08 NOTE — Anesthesia Procedure Notes (Signed)
Procedures

## 2012-06-08 NOTE — Transfer of Care (Signed)
Immediate Anesthesia Transfer of Care Note  Patient: Trevor Jackson  Procedure(s) Performed: Procedure(s) (LRB) with comments: CORONARY ARTERY BYPASS GRAFTING (CABG) (N/A) RADIAL ARTERY HARVEST (Left)  Patient Location: SICU  Anesthesia Type:General  Level of Consciousness: sedated  Airway & Oxygen Therapy: Patient remains intubated per anesthesia plan  Post-op Assessment: Report given to PACU RN and Post -op Vital signs reviewed and stable  Post vital signs: Reviewed and stable  Complications: No apparent anesthesia complications

## 2012-06-08 NOTE — Interval H&P Note (Signed)
History and Physical Interval Note:  06/08/2012 8:02 AM  [employer]  has presented today for surgery, with the diagnosis of CAD  The various methods of treatment have been discussed with the patient and family. After consideration of risks, benefits and other options for treatment, the patient has consented to  Procedure(s) (LRB) with comments: CORONARY ARTERY BYPASS GRAFTING (CABG) (N/A) - LEFT RADIAL ARTERY HARVEST RADIAL ARTERY HARVEST (Left) as a surgical intervention .  The patient's history has been reviewed, patient examined, no change in status, stable for surgery.  I have reviewed the patient's chart and labs.  Questions were answered to the patient's satisfaction.     OWEN,CLARENCE H

## 2012-06-08 NOTE — Procedures (Signed)
Extubation Procedure Note  Patient Details:   Name: [name] DOB: 01/26/1961 MRN: [id number]   Airway Documentation:     Evaluation  O2 sats: stable throughout Complications: No apparent complications Patient did tolerate procedure well. Bilateral Breath Sounds: Clear;Diminished   Yes   Patient extubated after successful MIP/NIF and Vital Capacity efforts.  Patient received deep oral suction and ETT suctioning prior to extubation.  Patient able to vocalize well post-extubation.  BBS clear with diminished bases.  No stridor noted.  Patient extubated to humidified 4LNC.  MIP -27 Vital Capacity 1.3L  Trevor Jackson 06/08/2012, 8:56 PM

## 2012-06-09 ENCOUNTER — Inpatient Hospital Stay (HOSPITAL_COMMUNITY): Payer: BC Managed Care – PPO

## 2012-06-09 LAB — CBC
Hemoglobin: 7.2 g/dL — ABNORMAL LOW (ref 13.0–17.0)
MCH: 28.1 pg (ref 26.0–34.0)
MCHC: 32.7 g/dL (ref 30.0–36.0)
Platelets: 129 10*3/uL — ABNORMAL LOW (ref 150–400)
Platelets: 99 10*3/uL — ABNORMAL LOW (ref 150–400)
RBC: 3.66 MIL/uL — ABNORMAL LOW (ref 4.22–5.81)
RDW: 13.3 % (ref 11.5–15.5)
WBC: 11.6 10*3/uL — ABNORMAL HIGH (ref 4.0–10.5)

## 2012-06-09 LAB — POCT I-STAT, CHEM 8
BUN: 9 mg/dL (ref 6–23)
Chloride: 100 mEq/L (ref 96–112)
HCT: 22 % — ABNORMAL LOW (ref 39.0–52.0)
Potassium: 4 mEq/L (ref 3.5–5.1)
Sodium: 135 mEq/L (ref 135–145)

## 2012-06-09 LAB — GLUCOSE, CAPILLARY
Glucose-Capillary: 190 mg/dL — ABNORMAL HIGH (ref 70–99)
Glucose-Capillary: 218 mg/dL — ABNORMAL HIGH (ref 70–99)
Glucose-Capillary: 170 mg/dL — ABNORMAL HIGH (ref 70–99)

## 2012-06-09 LAB — BASIC METABOLIC PANEL
CO2: 24 mEq/L (ref 19–32)
Chloride: 108 mEq/L (ref 96–112)
Creatinine, Ser: 0.84 mg/dL (ref 0.50–1.35)
GFR calc Af Amer: >90 mL/min (ref >90)
Sodium: 141 mEq/L (ref 135–145)

## 2012-06-09 LAB — CREATININE, SERUM
Creatinine, Ser: 0.56 mg/dL (ref 0.50–1.35)
GFR calc non Af Amer: >90 mL/min (ref >90)

## 2012-06-09 LAB — MAGNESIUM
Magnesium: 2.3 mg/dL (ref 1.5–2.5)
Magnesium: 1.3 mg/dL — ABNORMAL LOW (ref 1.5–2.5)

## 2012-06-09 MED ORDER — MORPHINE SULFATE 2 MG/ML IJ SOLN
2.0000 mg | INTRAMUSCULAR | Status: DC | PRN
  Administered 2012-06-09 (×7): 2 mg via INTRAVENOUS
  Filled 2012-06-09: qty 1
  Filled 2012-06-09: qty 2
  Filled 2012-06-09 (×2): qty 1
  Filled 2012-06-09: qty 2

## 2012-06-09 MED ORDER — INSULIN GLARGINE 100 UNIT/ML ~~LOC~~ SOLN
20.0000 [IU] | Freq: Two times a day (BID) | SUBCUTANEOUS | Status: DC
  Administered 2012-06-09 (×2): 20 [IU] via SUBCUTANEOUS

## 2012-06-09 MED ORDER — INSULIN REGULAR BOLUS VIA INFUSION
0.0000 [IU] | Freq: Three times a day (TID) | INTRAVENOUS | Status: DC
  Administered 2012-06-09: 1 [IU] via INTRAVENOUS
  Filled 2012-06-09: qty 10

## 2012-06-09 MED ORDER — TRAMADOL HCL 50 MG PO TABS
50.0000 mg | ORAL_TABLET | Freq: Four times a day (QID) | ORAL | Status: DC | PRN
  Administered 2012-06-09 – 2012-06-10 (×2): 100 mg via ORAL
  Filled 2012-06-09 (×2): qty 2

## 2012-06-09 MED ORDER — FUROSEMIDE 10 MG/ML IJ SOLN
20.0000 mg | Freq: Four times a day (QID) | INTRAMUSCULAR | Status: AC
  Administered 2012-06-09 (×3): 20 mg via INTRAVENOUS

## 2012-06-09 MED ORDER — INSULIN ASPART 100 UNIT/ML ~~LOC~~ SOLN
0.0000 [IU] | SUBCUTANEOUS | Status: DC
  Administered 2012-06-09: 2 [IU] via SUBCUTANEOUS
  Administered 2012-06-09: 4 [IU] via SUBCUTANEOUS

## 2012-06-09 MED ORDER — SODIUM CHLORIDE 0.9 % IV SOLN
INTRAVENOUS | Status: DC
  Administered 2012-06-09: 1.6 [IU]/h via INTRAVENOUS
  Filled 2012-06-09 (×3): qty 1

## 2012-06-09 MED ORDER — DEXTROSE 50 % IV SOLN: 25.0000 mL | INTRAVENOUS | Status: DC | PRN

## 2012-06-09 MED ORDER — ISOSORBIDE MONONITRATE 15 MG HALF TABLET
15.0000 mg | ORAL_TABLET | Freq: Every day | ORAL | Status: AC
  Administered 2012-06-09 – 2012-06-11 (×3): 15 mg via ORAL
  Filled 2012-06-09 (×3): qty 1

## 2012-06-09 MED ORDER — SODIUM CHLORIDE 0.9 % IV SOLN
INTRAVENOUS | Status: DC
  Administered 2012-06-09: 17:00:00 via INTRAVENOUS

## 2012-06-09 MED ORDER — KETOROLAC TROMETHAMINE 15 MG/ML IJ SOLN
15.0000 mg | Freq: Four times a day (QID) | INTRAMUSCULAR | Status: AC
  Administered 2012-06-09 – 2012-06-10 (×5): 15 mg via INTRAVENOUS
  Filled 2012-06-09 (×5): qty 1

## 2012-06-09 MED FILL — Magnesium Sulfate Inj 50%: INTRAMUSCULAR | Qty: 10 | Status: AC

## 2012-06-09 MED FILL — Potassium Chloride Inj 2 mEq/ML: INTRAVENOUS | Qty: 40 | Status: AC

## 2012-06-09 NOTE — Care Management Note (Signed)
    Page 1 of 1   06/09/2012     8:49:42 AM   CARE MANAGEMENT NOTE 06/09/2012  Patient:  JOSHUAL, TERRIO   Account Number:  [id number]  Date Initiated:  06/09/2012  Documentation initiated by:  Avie Arenas  Subjective/Objective Assessment:   post op CABG.  Has spouse.     Action/Plan:   Anticipated DC Date:  06/13/2012   Anticipated DC Plan:  HOME W HOME HEALTH SERVICES      DC Planning Services  CM consult      Choice offered to / List presented to:             Status of service:  In process, will continue to follow Medicare Important Message given?   (If response is "NO", the following Medicare IM given date fields will be blank) Date Medicare IM given:   Date Additional Medicare IM given:    Discharge Disposition:    Per UR Regulation:  Reviewed for med. necessity/level of care/duration of stay  If discussed at Long Length of Stay Meetings, dates discussed:    Comments:  Contact:  Sterkel,Jan Spouse [phone]   [phone]

## 2012-06-09 NOTE — Progress Notes (Addendum)
Patient ID: Trevor Jackson, male   DOB: May 29, 1961, 51 y.o.   MRN: [id number]                   [address]            [place] [address]          [phone]     1 Day Post-Op Procedure(s) (LRB): CORONARY ARTERY BYPASS GRAFTING (CABG) (N/A) RADIAL ARTERY HARVEST (Left)  Total Length of Stay:  LOS: 1 day  BP 117/71  Pulse 89  Temp 98.6 F (37 C) (Oral)  Resp 23  Ht 5\' 5"  (1.651 m)  Wt 271 lb 6.2 oz (123.1 kg)  BMI 45.16 kg/m2  SpO2 92%  .Intake/Output      12/12 0701 - 12/13 0700   P.O. 240   I.V. (mL/kg) 100.7 (0.8)   Blood    NG/GT    IV Piggyback 56   Total Intake(mL/kg) 396.7 (3.2)   Urine (mL/kg/hr) 1455 (1)   Blood    Chest Tube 110   Total Output 1565   Net -1168.3            . sodium chloride 20 mL/hr at 06/09/12 0400  . sodium chloride 20 mL/hr at 06/08/12 2300  . sodium chloride    . sodium chloride 20 mL/hr at 06/09/12 1645  . insulin (NOVOLIN-R) infusion 1.6 Units/hr (06/09/12 1807)  . nitroGLYCERIN Stopped (06/09/12 1137)     Lab Results  Component Value Date   WBC 10.9* 06/09/2012   HGB 7.5* 06/09/2012   HCT 22.0* 06/09/2012   PLT 99* 06/09/2012   GLUCOSE 159* 06/09/2012   CHOL 324* 01/07/2011   TRIG 341* 01/07/2011   HDL 52 01/07/2011   LDLCALC 204* 01/07/2011   ALT 33 06/06/2012   AST 32 06/06/2012   NA 135 06/09/2012   K 4.0 06/09/2012   CL 100 06/09/2012   CREATININE 0.60 06/09/2012   BUN 9 06/09/2012   CO2 24 06/09/2012   TSH 0.533 01/07/2011   INR 1.35 06/08/2012   HGBA1C 11.1* 06/06/2012   Stable post op day 1, hgb low but hemodynamics stable Tubes still in Back on insulin drip  Delight Ovens MD  Beeper [phone] Office [phone] 06/09/2012 7:15 PM

## 2012-06-09 NOTE — Plan of Care (Signed)
Problem: Phase II Progression Outcomes Goal: Patient extubated within - Outcome: Completed/Met Date Met:  06/09/12 6hours

## 2012-06-09 NOTE — Progress Notes (Signed)
   CARDIOTHORACIC SURGERY PROGRESS NOTE   R1 Day Post-Op Procedure(s) (LRB): CORONARY ARTERY BYPASS GRAFTING (CABG) (N/A) RADIAL ARTERY HARVEST (Left)  Subjective: Looks good.  Mild soreness in chest.  No SOB.  Objective: Vital signs: BP Readings from Last 1 Encounters:  06/09/12 108/76   Pulse Readings from Last 1 Encounters:  06/09/12 92   Resp Readings from Last 1 Encounters:  06/09/12 24   Temp Readings from Last 1 Encounters:  06/09/12 98.2 F (36.8 C) Oral    Hemodynamics: PAP: (20-49)/(10-34) 35/16 mmHg CO:  [3.9 L/min-5.4 L/min] 4.8 L/min CI:  [1.8 L/min/m2-2.4 L/min/m2] 2.3 L/min/m2  Physical Exam:  Rhythm:   sinus  Breath sounds: clear  Heart sounds:  RRR  Incisions:  Dressings dry, intact  Abdomen:  soft  Extremities:  warm   Intake/Output from previous day: 12/11 0701 - 12/12 0700 In: 6201.4 [P.O.:120; I.V.:4047.4; Blood:354; NG/GT:30; IV Piggyback:1650] Out: 5025 [Urine:2745; Blood:1650; Chest Tube:630] Intake/Output this shift: Total I/O In: 3.1 [I.V.:3.1] Out: -   Lab Results:  Basename 06/09/12 0415 06/08/12 2313  WBC 11.6* 11.6*  HGB 10.4* 10.7*  HCT 30.7* 32.0*  PLT 129* 133*   BMET:  Basename 06/09/12 0415 06/08/12 2313 06/08/12 2305 06/06/12 1326  NA 141 -- 141 --  K 4.4 -- 4.8 --  CL 108 -- 108 --  CO2 24 -- -- 23  GLUCOSE 137* -- 171* --  BUN 12 -- 13 --  CREATININE 0.84 0.82 -- --  CALCIUM 8.3* -- -- 9.8    CBG (last 3)   Basename 06/09/12 0408 06/09/12 0042 06/08/12 2010  GLUCAP 127* 158* 166*   ABG    Component Value Date/Time   PHART 7.288* 06/08/2012 2302   HCO3 21.7 06/08/2012 2302   TCO2 20 06/08/2012 2305   ACIDBASEDEF 5.0* 06/08/2012 2302   O2SAT 91.0 06/08/2012 2302   CXR: Mild bibasilar atelectasis and congestion  Assessment/Plan: S/P Procedure(s) (LRB): CORONARY ARTERY BYPASS GRAFTING (CABG) (N/A) RADIAL ARTERY HARVEST (Left)  Doing well POD1 Expected post op acute blood loss anemia, mild,  stable Expected post op volume excess, mild Type II diabetes mellitus, excellent glycemic control   Mobilize  Diuresis  Add lantus insulin  D/C lines, possibly d/c tubes later today   OWEN,CLARENCE H 06/09/2012 7:47 AM

## 2012-06-09 NOTE — Progress Notes (Signed)
Nutrition Brief Note  Patient identified on the Malnutrition Screening Tool (MST) Report for recent weight loss without trying (patient unsure). Patient's weight has been stable per readings below:  Wt Readings from Last 10 Encounters:  06/09/12 271 lb 6.2 oz (123.1 kg)  06/09/12 271 lb 6.2 oz (123.1 kg)  06/06/12 263 lb 7.2 oz (119.5 kg)  06/06/12 261 lb (118.389 kg)  05/18/12 261 lb (118.389 kg)  05/13/12 261 lb (118.389 kg)  05/13/12 261 lb (118.389 kg)  02/18/12 262 lb 12.8 oz (119.205 kg)    Body mass index is 45.16 kg/(m^2). Pt meets criteria for Obesity Class III based on current BMI.   Current diet order is Clear Liquids, s/p CABG 12/11. Labs and medications reviewed.   No nutrition interventions warranted at this time. If nutrition issues arise, please consult RD.   Kirkland Hun, RD, LDN Pager #: [phone] After-Hours Pager #: [phone]

## 2012-06-09 NOTE — Significant Event (Signed)
Pt latest CBG=218. Per protocol-will initiate insulin drip.

## 2012-06-09 NOTE — Progress Notes (Signed)
Swan pulled on during dangle; RV waveform noted with PVC's noted; Swan position originally at 50 cm now at 40 cm. Swan pulled back to 30 cm; RA waveform noted; no PVC's noted.

## 2012-06-10 ENCOUNTER — Encounter (HOSPITAL_COMMUNITY): Payer: Self-pay | Admitting: *Deleted

## 2012-06-10 ENCOUNTER — Inpatient Hospital Stay (HOSPITAL_COMMUNITY): Payer: BC Managed Care – PPO

## 2012-06-10 LAB — CBC
Platelets: 123 10*3/uL — ABNORMAL LOW (ref 150–400)
RBC: 3.39 MIL/uL — ABNORMAL LOW (ref 4.22–5.81)
RDW: 13.4 % (ref 11.5–15.5)
WBC: 16.2 10*3/uL — ABNORMAL HIGH (ref 4.0–10.5)

## 2012-06-10 LAB — GLUCOSE, CAPILLARY
Glucose-Capillary: 194 mg/dL — ABNORMAL HIGH (ref 70–99)
Glucose-Capillary: 99 mg/dL (ref 70–99)
Glucose-Capillary: 105 mg/dL — ABNORMAL HIGH (ref 70–99)
Glucose-Capillary: 121 mg/dL — ABNORMAL HIGH (ref 70–99)
Glucose-Capillary: 113 mg/dL — ABNORMAL HIGH (ref 70–99)
Glucose-Capillary: 108 mg/dL — ABNORMAL HIGH (ref 70–99)
Glucose-Capillary: 126 mg/dL — ABNORMAL HIGH (ref 70–99)
Glucose-Capillary: 143 mg/dL — ABNORMAL HIGH (ref 70–99)

## 2012-06-10 LAB — BASIC METABOLIC PANEL
Calcium: 8.3 mg/dL — ABNORMAL LOW (ref 8.4–10.5)
Creatinine, Ser: 0.87 mg/dL (ref 0.50–1.35)
GFR calc Af Amer: >90 mL/min (ref >90)
Sodium: 137 mEq/L (ref 135–145)

## 2012-06-10 MED ORDER — SODIUM CHLORIDE 0.9 % IV SOLN: 250.0000 mL | INTRAVENOUS | Status: DC | PRN

## 2012-06-10 MED ORDER — FUROSEMIDE 10 MG/ML IJ SOLN
40.0000 mg | Freq: Once | INTRAMUSCULAR | Status: AC
  Administered 2012-06-10: 40 mg via INTRAVENOUS
  Filled 2012-06-10: qty 4

## 2012-06-10 MED ORDER — POTASSIUM CHLORIDE CRYS ER 20 MEQ PO TBCR
20.0000 meq | EXTENDED_RELEASE_TABLET | Freq: Two times a day (BID) | ORAL | Status: DC
  Administered 2012-06-11 – 2012-06-14 (×7): 20 meq via ORAL
  Filled 2012-06-10 (×9): qty 1

## 2012-06-10 MED ORDER — DEXTROSE 5 % IV SOLN
1.5000 g | Freq: Once | INTRAVENOUS | Status: AC
  Administered 2012-06-10: 1.5 g via INTRAVENOUS
  Filled 2012-06-10: qty 1.5

## 2012-06-10 MED ORDER — ATORVASTATIN CALCIUM 20 MG PO TABS
20.0000 mg | ORAL_TABLET | Freq: Every day | ORAL | Status: DC
  Administered 2012-06-10 – 2012-06-13 (×4): 20 mg via ORAL
  Filled 2012-06-10 (×5): qty 1

## 2012-06-10 MED ORDER — NIACIN ER (ANTIHYPERLIPIDEMIC) 500 MG PO TBCR
500.0000 mg | EXTENDED_RELEASE_TABLET | Freq: Two times a day (BID) | ORAL | Status: DC
  Administered 2012-06-10 – 2012-06-14 (×9): 500 mg via ORAL
  Filled 2012-06-10 (×11): qty 1

## 2012-06-10 MED ORDER — LINAGLIPTIN-METFORMIN HCL 2.5-500 MG PO TABS: 1.0000 | ORAL_TABLET | Freq: Two times a day (BID) | ORAL | Status: DC

## 2012-06-10 MED ORDER — MOVING RIGHT ALONG BOOK
Freq: Once | Status: AC
  Administered 2012-06-10: 09:00:00
  Filled 2012-06-10: qty 1

## 2012-06-10 MED ORDER — METFORMIN HCL 500 MG PO TABS
500.0000 mg | ORAL_TABLET | Freq: Two times a day (BID) | ORAL | Status: DC
  Administered 2012-06-10 – 2012-06-14 (×9): 500 mg via ORAL
  Filled 2012-06-10 (×12): qty 1

## 2012-06-10 MED ORDER — SODIUM CHLORIDE 0.9 % IJ SOLN
3.0000 mL | Freq: Two times a day (BID) | INTRAMUSCULAR | Status: DC
  Administered 2012-06-10 – 2012-06-13 (×8): 3 mL via INTRAVENOUS

## 2012-06-10 MED ORDER — POTASSIUM CHLORIDE CRYS ER 20 MEQ PO TBCR
40.0000 meq | EXTENDED_RELEASE_TABLET | Freq: Once | ORAL | Status: AC
  Administered 2012-06-10: 40 meq via ORAL
  Filled 2012-06-10: qty 2

## 2012-06-10 MED ORDER — INSULIN GLARGINE 100 UNIT/ML ~~LOC~~ SOLN
36.0000 [IU] | Freq: Two times a day (BID) | SUBCUTANEOUS | Status: DC
  Administered 2012-06-10 – 2012-06-12 (×6): 36 [IU] via SUBCUTANEOUS

## 2012-06-10 MED ORDER — LINAGLIPTIN 5 MG PO TABS
2.5000 mg | ORAL_TABLET | Freq: Two times a day (BID) | ORAL | Status: DC
  Administered 2012-06-10 – 2012-06-14 (×7): 2.5 mg via ORAL
  Filled 2012-06-10 (×13): qty 1

## 2012-06-10 MED ORDER — SODIUM CHLORIDE 0.9 % IJ SOLN: 3.0000 mL | INTRAMUSCULAR | Status: DC | PRN

## 2012-06-10 MED ORDER — ZOLPIDEM TARTRATE 5 MG PO TABS: 10.0000 mg | ORAL_TABLET | Freq: Every evening | ORAL | Status: DC | PRN

## 2012-06-10 MED ORDER — METOPROLOL TARTRATE 50 MG PO TABS: 50.0000 mg | ORAL_TABLET | Freq: Two times a day (BID) | ORAL | Status: DC

## 2012-06-10 MED ORDER — POTASSIUM CHLORIDE 10 MEQ/50ML IV SOLN: 10.0000 meq | INTRAVENOUS | Status: DC

## 2012-06-10 MED ORDER — MIDAZOLAM HCL 2 MG/2ML IJ SOLN
2.0000 mg | Freq: Once | INTRAMUSCULAR | Status: AC
  Administered 2012-06-10: 2 mg via INTRAVENOUS
  Filled 2012-06-10: qty 2

## 2012-06-10 MED ORDER — METOPROLOL TARTRATE 25 MG PO TABS
25.0000 mg | ORAL_TABLET | Freq: Two times a day (BID) | ORAL | Status: DC
  Administered 2012-06-10 – 2012-06-14 (×9): 25 mg via ORAL
  Filled 2012-06-10 (×10): qty 1

## 2012-06-10 MED ORDER — FUROSEMIDE 40 MG PO TABS
40.0000 mg | ORAL_TABLET | Freq: Two times a day (BID) | ORAL | Status: DC
  Administered 2012-06-10 – 2012-06-11 (×3): 40 mg via ORAL
  Filled 2012-06-10 (×5): qty 1

## 2012-06-10 MED ORDER — INSULIN ASPART 100 UNIT/ML ~~LOC~~ SOLN
0.0000 [IU] | Freq: Three times a day (TID) | SUBCUTANEOUS | Status: DC
  Administered 2012-06-10 – 2012-06-11 (×5): 2 [IU] via SUBCUTANEOUS
  Administered 2012-06-11 – 2012-06-12 (×3): 4 [IU] via SUBCUTANEOUS
  Administered 2012-06-12 (×2): 2 [IU] via SUBCUTANEOUS
  Administered 2012-06-12: 12 [IU] via SUBCUTANEOUS
  Administered 2012-06-13: 8 [IU] via SUBCUTANEOUS
  Administered 2012-06-13 – 2012-06-14 (×3): 2 [IU] via SUBCUTANEOUS

## 2012-06-10 MED ORDER — LOSARTAN POTASSIUM 50 MG PO TABS
50.0000 mg | ORAL_TABLET | Freq: Every day | ORAL | Status: DC
  Administered 2012-06-10 – 2012-06-13 (×4): 50 mg via ORAL
  Filled 2012-06-10 (×5): qty 1

## 2012-06-10 MED ORDER — LOSARTAN POTASSIUM 50 MG PO TABS: 100.0000 mg | ORAL_TABLET | Freq: Every day | ORAL | Status: DC

## 2012-06-10 MED FILL — Electrolyte-R (PH 7.4) Solution: INTRAVENOUS | Qty: 4000 | Status: AC

## 2012-06-10 MED FILL — Heparin Sodium (Porcine) Inj 1000 Unit/ML: INTRAMUSCULAR | Qty: 10 | Status: AC

## 2012-06-10 MED FILL — Sodium Bicarbonate IV Soln 8.4%: INTRAVENOUS | Qty: 50 | Status: AC

## 2012-06-10 MED FILL — Heparin Sodium (Porcine) Inj 1000 Unit/ML: INTRAMUSCULAR | Qty: 30 | Status: AC

## 2012-06-10 MED FILL — Sodium Chloride IV Soln 0.9%: INTRAVENOUS | Qty: 1000 | Status: AC

## 2012-06-10 MED FILL — Lidocaine HCl IV Inj 20 MG/ML: INTRAVENOUS | Qty: 5 | Status: AC

## 2012-06-10 MED FILL — Sodium Chloride Irrigation Soln 0.9%: Qty: 3000 | Status: AC

## 2012-06-10 MED FILL — Mannitol IV Soln 20%: INTRAVENOUS | Qty: 500 | Status: AC

## 2012-06-10 NOTE — Anesthesia Postprocedure Evaluation (Signed)
  Anesthesia Post-op Note  Patient: Trevor Jackson  Procedure(s) Performed: Procedure(s) (LRB) with comments: CORONARY ARTERY BYPASS GRAFTING (CABG) (N/A) RADIAL ARTERY HARVEST (Left)  Patient Location: PACU and SICU  Anesthesia Type:General  Level of Consciousness: awake, alert , oriented and patient cooperative  Airway and Oxygen Therapy: Patient Spontanous Breathing and Patient connected to nasal cannula oxygen  Post-op Pain: mild  Post-op Assessment: Post-op Vital signs reviewed and Patient's Cardiovascular Status Stable  Post-op Vital Signs: Reviewed and stable  Complications: No apparent anesthesia complications

## 2012-06-10 NOTE — Progress Notes (Signed)
Pt assisted up OOB to chair at this time; O2 sats 4L now 92%; pt encouraged to deep breath and use IS; will cont. To monitor.

## 2012-06-10 NOTE — Progress Notes (Signed)
O2 sats highest at 90-91% on 4L [place]; looking at sat trend, sats were up to 94%; pt SOB on exertion; will make MD aware; will cont. To monitor.

## 2012-06-10 NOTE — Progress Notes (Signed)
   CARDIOTHORACIC SURGERY PROGRESS NOTE   R2 Days Post-Op Procedure(s) (LRB): CORONARY ARTERY BYPASS GRAFTING (CABG) (N/A) RADIAL ARTERY HARVEST (Left)  Subjective: Doing fairly well but didn't sleep last night.    Objective: Vital signs: BP Readings from Last 1 Encounters:  06/10/12 141/74   Pulse Readings from Last 1 Encounters:  06/10/12 92   Resp Readings from Last 1 Encounters:  06/10/12 21   Temp Readings from Last 1 Encounters:  06/10/12 98.6 F (37 C) Oral    Hemodynamics: PAP: (27)/(14) 27/14 mmHg  Physical Exam:  Rhythm:   sinus  Breath sounds: clear  Heart sounds:  RRR  Incisions:  Dressings dry  Abdomen:  soft  Extremities:  warm   Intake/Output from previous day: 12/12 0701 - 12/13 0700 In: 836.6 [P.O.:360; I.V.:370.6; IV Piggyback:106] Out: 2590 [Urine:2240; Chest Tube:350] Intake/Output this shift:    Lab Results:  Basename 06/10/12 0405 06/09/12 1728 06/09/12 1700  WBC 16.2* -- 10.9*  HGB 9.6* 7.5* --  HCT 28.5* 22.0* --  PLT 123* -- 99*   BMET:  Basename 06/10/12 0405 06/09/12 1728 06/09/12 0415  NA 137 135 --  K 3.8 4.0 --  CL 99 100 --  CO2 29 -- 24  GLUCOSE 99 159* --  BUN 14 9 --  CREATININE 0.87 0.60 --  CALCIUM 8.3* -- 8.3*    CBG (last 3)   Basename 06/10/12 0757 06/10/12 0703 06/10/12 0603  GLUCAP 108* 113* 121*   ABG    Component Value Date/Time   PHART 7.288* 06/08/2012 2302   HCO3 21.7 06/08/2012 2302   TCO2 20 06/09/2012 1728   ACIDBASEDEF 5.0* 06/08/2012 2302   O2SAT 91.0 06/08/2012 2302   CXR: Mild bibasilar atelectasis and pulm vasc congestion L>R  Assessment/Plan: S/P Procedure(s) (LRB): CORONARY ARTERY BYPASS GRAFTING (CABG) (N/A) RADIAL ARTERY HARVEST (Left)  Overall doing well POD2 Expected post op acute blood loss anemia, mild, stable Expected post op volume excess, mild, diuresing Type II diabetes mellitus, excellent glycemic control but back on insulin drip   D/C chest  tubes  Mobilize  Diuresis  Increase beta blocker  Restart ARB  Restart home oral agents for DMII and increase lantus insulin  Transfer step down   Rico Massar H 06/10/2012 8:12 AM

## 2012-06-10 NOTE — Progress Notes (Signed)
O2 sats 2L down to 84%; O2 increased to 4L [place]; sats slowly increased to 91% 4L [place]; will cont. To monitor.

## 2012-06-10 NOTE — Progress Notes (Signed)
Pt reports nausea has improved slightly, but worsens with movement; pt assisted up OOB to BR to have BM; pt back to bed; will cont. To monitor.

## 2012-06-10 NOTE — Progress Notes (Signed)
Pt given 4mg  IV zofran at this time for nausea; will cont. To monitor.

## 2012-06-10 NOTE — Plan of Care (Signed)
Problem: Phase III Progression Outcomes Goal: Time patient transferred to PCTU/Telemetry POD Outcome: Completed/Met Date Met:  06/10/12 1400-pt ambulated safely to unit 2015, on 4L [place], settled in bed. VS stable prior and during the ambulation. No personal belongings at bedside of 2314 that could be taken up to patient's new room. Report given to receiving RN, Asher Muir. Pt spouse at bedside. Mafalda Mcginniss, Charity fundraiser.

## 2012-06-10 NOTE — Progress Notes (Signed)
40mg  IV Lasix X1 given at this time; O2 sats 93% 4L; pt sitting up in chair; will cont. To monitor.

## 2012-06-10 NOTE — Significant Event (Signed)
Verbal orders from MD Cornelius Moras to change potassium chloride (3 runs) to PO OTO for this morning. RN to place order per MD. Wallis Bamberg, RN.

## 2012-06-10 NOTE — Progress Notes (Signed)
Spoke with Dr. Cornelius Moras; order to administer 40mg  IV Lasix X1 now; will cont. To monitor.

## 2012-06-11 ENCOUNTER — Inpatient Hospital Stay (HOSPITAL_COMMUNITY): Payer: BC Managed Care – PPO

## 2012-06-11 LAB — GLUCOSE, CAPILLARY
Glucose-Capillary: 146 mg/dL — ABNORMAL HIGH (ref 70–99)
Glucose-Capillary: 183 mg/dL — ABNORMAL HIGH (ref 70–99)

## 2012-06-11 LAB — BASIC METABOLIC PANEL
Calcium: 8.5 mg/dL (ref 8.4–10.5)
GFR calc Af Amer: >90 mL/min (ref >90)
GFR calc non Af Amer: >90 mL/min (ref >90)
Glucose, Bld: 130 mg/dL — ABNORMAL HIGH (ref 70–99)
Sodium: 136 mEq/L (ref 135–145)

## 2012-06-11 LAB — CBC
Hemoglobin: 9.2 g/dL — ABNORMAL LOW (ref 13.0–17.0)
MCH: 27.9 pg (ref 26.0–34.0)
MCHC: 32.9 g/dL (ref 30.0–36.0)
RDW: 13.3 % (ref 11.5–15.5)

## 2012-06-11 MED ORDER — FUROSEMIDE 10 MG/ML IJ SOLN
40.0000 mg | Freq: Four times a day (QID) | INTRAMUSCULAR | Status: AC
  Administered 2012-06-11 (×3): 40 mg via INTRAVENOUS
  Filled 2012-06-11 (×3): qty 4

## 2012-06-11 MED ORDER — ALBUTEROL SULFATE (5 MG/ML) 0.5% IN NEBU
2.5000 mg | INHALATION_SOLUTION | RESPIRATORY_TRACT | Status: DC | PRN
Start: 2012-06-11 — End: 2012-06-11

## 2012-06-11 MED ORDER — ALBUTEROL SULFATE (5 MG/ML) 0.5% IN NEBU
2.5000 mg | INHALATION_SOLUTION | RESPIRATORY_TRACT | Status: DC
  Administered 2012-06-11 – 2012-06-12 (×7): 2.5 mg via RESPIRATORY_TRACT
  Filled 2012-06-11 (×7): qty 0.5

## 2012-06-11 MED ORDER — FUROSEMIDE 40 MG PO TABS
40.0000 mg | ORAL_TABLET | Freq: Two times a day (BID) | ORAL | Status: DC
  Administered 2012-06-12 – 2012-06-14 (×5): 40 mg via ORAL
  Filled 2012-06-11 (×7): qty 1

## 2012-06-11 MED ORDER — FUROSEMIDE 10 MG/ML IJ SOLN
40.0000 mg | Freq: Once | INTRAMUSCULAR | Status: AC
  Administered 2012-06-11: 40 mg via INTRAVENOUS
  Filled 2012-06-11: qty 4

## 2012-06-11 MED ORDER — ALBUTEROL SULFATE (5 MG/ML) 0.5% IN NEBU
INHALATION_SOLUTION | RESPIRATORY_TRACT | Status: AC
  Administered 2012-06-11: 2.5 mg
  Filled 2012-06-11: qty 0.5

## 2012-06-11 NOTE — Progress Notes (Signed)
Pt ambulated 200 ft with RW and 4L O2.  Returned to chair after walk with call bell in reach and wife at side.  Will con't plan of care.

## 2012-06-11 NOTE — Progress Notes (Signed)
CARDIAC REHAB PHASE I   PRE:  Rate/Rhythm: Sr 92  BP:  Supine:  Sitting: 140/80  Standing:    SaO2: 95 4lncc  MODE:  Ambulation: 550 ft 1 standing rest breaks   POST:  Rate/Rhythem: 89  BP:  Supine:   Sitting: 115/64  Standing:    SaO2: 95 4lnc Ambulated with pt x 1 assist with use of rolling walker and O2 support at 4lncc.  Pt o2 saturation checked 1/2 point (300 feet)  89% slight shortness of breath noted.  Pt instructed to PLB o2 sat increased to 90-92 after 45 seconds.  Pt demonstrated appropriately.  Pt assisted back to bed in preparation for removal of pacing wires.  Pt denied any complaints, call bell in place. Pt instructed to walk at least 2 additional times today with nursing staff.  Verbalized understanding  Arna Medici [phone]

## 2012-06-11 NOTE — Progress Notes (Signed)
EPW's pulled per order and unit protocol.  Pt tolerated very well, all tips intact, sites painted.  VSS, see flowsheet.  Pt understands bedrest for one hour, call bell in reach, will monitor closely.

## 2012-06-11 NOTE — Progress Notes (Addendum)
3 Days Post-Op Procedure(s) (LRB): CORONARY ARTERY BYPASS GRAFTING (CABG) (N/A) RADIAL ARTERY HARVEST (Left) Subjective:  Trevor Jackson complains of pain along chest and left arm this morning.  He also complains of some shortness of breath with associated cough with blood tinged sputum. +BM  Objective: Vital signs in last 24 hours: Temp:  [97.6 F (36.4 C)-98.7 F (37.1 C)] 98.3 F (36.8 C) (12/14 0448) Pulse Rate:  [86-95] 86  (12/14 0448) Cardiac Rhythm:  [-] Normal sinus rhythm (12/13 2233) Resp:  [17-22] 19  (12/14 0448) BP: (105-169)/(64-87) 146/87 mmHg (12/14 0448) SpO2:  [84 %-99 %] 95 % (12/14 0551) Weight:  [270 lb (122.471 kg)] 270 lb (122.471 kg) (12/14 0448)  Intake/Output from previous day: 12/13 0701 - 12/14 0700 In: 424.9 [P.O.:360; I.V.:12.9; IV Piggyback:52] Out: 1125 [Urine:1035; Chest Tube:90]  General appearance: alert, cooperative and no distress Heart: regular rate and rhythm Lungs: wheezes bilaterally Abdomen: soft, non-tender; bowel sounds normal; no masses,  no organomegaly Extremities: edema trace Wound: LUE Radial Harvest site is edematous ? underlying hematoma, LLE, Sternotomy C/D/I  Lab Results:  Basename 06/11/12 0700 06/10/12 0405  WBC 9.8 16.2*  HGB 9.2* 9.6*  HCT 28.0* 28.5*  PLT 130* 123*   BMET:  Basename 06/10/12 0405 06/09/12 1728 06/09/12 0415  NA 137 135 --  K 3.8 4.0 --  CL 99 100 --  CO2 29 -- 24  GLUCOSE 99 159* --  BUN 14 9 --  CREATININE 0.87 0.60 --  CALCIUM 8.3* -- 8.3*    PT/INR:  Basename 06/08/12 1452  LABPROT 16.4*  INR 1.35   ABG    Component Value Date/Time   PHART 7.288* 06/08/2012 2302   HCO3 21.7 06/08/2012 2302   TCO2 20 06/09/2012 1728   ACIDBASEDEF 5.0* 06/08/2012 2302   O2SAT 91.0 06/08/2012 2302   CBG (last 3)   Basename 06/11/12 0558 06/10/12 2027 06/10/12 1636  GLUCAP 127* 144* 144*    Assessment/Plan: S/P Procedure(s) (LRB): CORONARY ARTERY BYPASS GRAFTING (CABG) (N/A) RADIAL ARTERY  HARVEST (Left)  1. CV- NSR rate controlled, Hypertensive- will increase Cozaar to home dose of 100mg  daily, continue Lopressor, Imdur(Radial graft) 2. Pulm- patient with dyspnea, wheezing, CXR with worsening atelectasis, will change albuterol to scheduled, order flutter valve 3. Acute post operative anemia- stable, H/H 9.2/28.0 4. Volume Overload- weight is up, continue diuresis 5. DM- CBGS controlled continue current regimen, patients preop A1c elevated at 11.1 6. LUE Radial Harvest site- edematous, ? Underlying hematoma? 7. Dispo- will d/c EPW this morning, aggressive pulm toilet   LOS: 3 days    Trevor Jackson, Trevor Jackson 06/11/2012    I have seen and examined the patient and agree with the assessment and plan as outlined.  Increase Cozaar.  Continue lasix IV today.  Mobilize.  Pulm toilet.  Anallely Rosell H 06/11/2012 11:22 AM

## 2012-06-12 LAB — GLUCOSE, CAPILLARY
Glucose-Capillary: 252 mg/dL — ABNORMAL HIGH (ref 70–99)
Glucose-Capillary: 160 mg/dL — ABNORMAL HIGH (ref 70–99)

## 2012-06-12 MED ORDER — ALBUTEROL SULFATE (5 MG/ML) 0.5% IN NEBU: 2.5000 mg | INHALATION_SOLUTION | RESPIRATORY_TRACT | Status: DC | PRN

## 2012-06-12 MED ORDER — ALBUTEROL SULFATE (5 MG/ML) 0.5% IN NEBU
2.5000 mg | INHALATION_SOLUTION | Freq: Three times a day (TID) | RESPIRATORY_TRACT | Status: DC
  Filled 2012-06-12 (×2): qty 0.5

## 2012-06-12 NOTE — Progress Notes (Signed)
Pt ambulated 700 ft on 3L O2, without RW.  Gait steady, no complaints.  To recliner after walk for SPO2 check.  Able to tolerate wean to 2L with SPO2 at 95%.

## 2012-06-12 NOTE — Progress Notes (Addendum)
4 Days Post-Op Procedure(s) (LRB): CORONARY ARTERY BYPASS GRAFTING (CABG) (N/A) RADIAL ARTERY HARVEST (Left) Subjective:  Trevor Jackson has no complaints this morning.  He states he is breathing much better.  He remains on 4L of oxygen via [place]. +BM  Objective: Vital signs in last 24 hours: Temp:  [98.4 F (36.9 C)-98.9 F (37.2 C)] 98.4 F (36.9 C) (12/15 0433) Pulse Rate:  [85-99] 85  (12/15 0433) Cardiac Rhythm:  [-] Normal sinus rhythm (12/15 0750) Resp:  [18-19] 18  (12/15 0433) BP: (116-133)/(73-83) 121/80 mmHg (12/15 0433) SpO2:  [92 %-99 %] 94 % (12/15 0445) Weight:  [261 lb 9.6 oz (118.661 kg)] 261 lb 9.6 oz (118.661 kg) (12/15 0433)  Intake/Output from previous day: 12/14 0701 - 12/15 0700 In: -  Out: 1850 [Urine:1850]  General appearance: alert, cooperative and no distress Heart: regular rate and rhythm Lungs: diminished breath sounds bibasilar Abdomen: soft, non-tender; bowel sounds normal; no masses,  no organomegaly Extremities: edema trace Wound: clean and dry  Lab Results:  Basename 06/11/12 0700 06/10/12 0405  WBC 9.8 16.2*  HGB 9.2* 9.6*  HCT 28.0* 28.5*  PLT 130* 123*   BMET:  Basename 06/11/12 0700 06/10/12 0405  NA 136 137  K 4.3 3.8  CL 96 99  CO2 29 29  GLUCOSE 130* 99  BUN 14 14  CREATININE 0.70 0.87  CALCIUM 8.5 8.3*    PT/INR: No results found for this basename: LABPROT,INR in the last 72 hours ABG    Component Value Date/Time   PHART 7.288* 06/08/2012 2302   HCO3 21.7 06/08/2012 2302   TCO2 20 06/09/2012 1728   ACIDBASEDEF 5.0* 06/08/2012 2302   O2SAT 91.0 06/08/2012 2302   CBG (last 3)   Basename 06/12/12 0620 06/11/12 2121 06/11/12 1629  GLUCAP 153* 183* 163*    Assessment/Plan: S/P Procedure(s) (LRB): CORONARY ARTERY BYPASS GRAFTING (CABG) (N/A) RADIAL ARTERY HARVEST (Left)  1. CV- NSR rate controlled, hypertension improved, continue Lopressor, Cozaar and Imdur for Radial Graft 2. Pulm- wheezing resolved, will continue  to wean oxygen, aggressive IS use, repeat CXR in AM 3. Acute post operative anemia stable 4. Volume Overload- weight is below baseline, will continue oral diuresis for now 5. DM- CBGs controlled 6. DIspo- patients breathing improved, will need to wean off oxygen prior to discharge, will repeat CXR in AM   LOS: 4 days    BARRETT, ERIN 06/12/2012   I have seen and examined the patient and agree with the assessment and plan as outlined.  Deziray Nabi H 06/12/2012 11:06 AM

## 2012-06-13 ENCOUNTER — Inpatient Hospital Stay (HOSPITAL_COMMUNITY): Payer: BC Managed Care – PPO

## 2012-06-13 LAB — GLUCOSE, CAPILLARY
Glucose-Capillary: 133 mg/dL — ABNORMAL HIGH (ref 70–99)
Glucose-Capillary: 136 mg/dL — ABNORMAL HIGH (ref 70–99)
Glucose-Capillary: 212 mg/dL — ABNORMAL HIGH (ref 70–99)

## 2012-06-13 MED ORDER — ALBUTEROL SULFATE (5 MG/ML) 0.5% IN NEBU: 2.5000 mg | INHALATION_SOLUTION | Freq: Four times a day (QID) | RESPIRATORY_TRACT | Status: DC | PRN

## 2012-06-13 MED ORDER — POTASSIUM CHLORIDE CRYS ER 20 MEQ PO TBCR: 20.0000 meq | EXTENDED_RELEASE_TABLET | Freq: Two times a day (BID) | ORAL | Status: DC

## 2012-06-13 MED ORDER — INSULIN GLARGINE 100 UNIT/ML ~~LOC~~ SOLN: 40.0000 [IU] | Freq: Two times a day (BID) | SUBCUTANEOUS | Status: DC

## 2012-06-13 MED ORDER — ASPIRIN 325 MG PO TBEC: 325.0000 mg | DELAYED_RELEASE_TABLET | Freq: Every day | ORAL | Status: DC

## 2012-06-13 MED ORDER — OXYCODONE HCL 5 MG PO TABS
5.0000 mg | ORAL_TABLET | ORAL | Status: DC | PRN
Start: 2012-06-13 — End: 2012-06-20

## 2012-06-13 MED ORDER — FUROSEMIDE 40 MG PO TABS: 40.0000 mg | ORAL_TABLET | Freq: Every day | ORAL | Status: DC

## 2012-06-13 MED ORDER — LOSARTAN POTASSIUM 50 MG PO TABS: 50.0000 mg | ORAL_TABLET | Freq: Every day | ORAL | Status: DC

## 2012-06-13 MED ORDER — ATORVASTATIN CALCIUM 20 MG PO TABS: 20.0000 mg | ORAL_TABLET | Freq: Every day | ORAL | Status: DC

## 2012-06-13 MED ORDER — METOPROLOL TARTRATE 25 MG PO TABS: 25.0000 mg | ORAL_TABLET | Freq: Two times a day (BID) | ORAL | Status: DC

## 2012-06-13 MED ORDER — INSULIN GLARGINE 100 UNIT/ML ~~LOC~~ SOLN
40.0000 [IU] | Freq: Two times a day (BID) | SUBCUTANEOUS | Status: DC
  Administered 2012-06-13: 40 [IU] via SUBCUTANEOUS
  Administered 2012-06-13: 10:00:00 via SUBCUTANEOUS
  Administered 2012-06-14: 40 [IU] via SUBCUTANEOUS

## 2012-06-13 NOTE — Progress Notes (Addendum)
Inpatient Diabetes Program Recommendations  AACE/ADA: New Consensus Statement on Inpatient Glycemic Control (2013)  Target Ranges:  Prepandial:   less than 140 mg/dL      Peak postprandial:   less than 180 mg/dL (1-2 hours)      Critically ill patients:  140 - 180 mg/dL   Diabetes Coordinator spoke with patient and wife concerning A1C=11.1 and management of diabetes.  Patient states that he has been for OP education a long time ago and would be willing to go again.  OP order has been placed.  Patient does have a meter at home to check his glucose.  States that he let his guard down and he will start making changes to get his glucose under control. A Daily Diabetes Meal Planning Guide was left with the patient. No further questions/concerns at this time.   Thank you  Piedad Climes [facility] Inpatient Diabetes Coordinator [phone]

## 2012-06-13 NOTE — Progress Notes (Addendum)
[address]            [place] [address]          [phone]     5 Days Post-Op  Procedure(s) (LRB): CORONARY ARTERY BYPASS GRAFTING (CABG) (N/A) RADIAL ARTERY HARVEST (Left) Subjective: Feels ok  Objective  Telemetry sinus rhythm, occ PVC's   Temp:  [98.2 F (36.8 C)-98.6 F (37 C)] 98.2 F (36.8 C) (12/16 0446) Pulse Rate:  [90-102] 90  (12/16 0446) Resp:  [16-18] 18  (12/16 0446) BP: (140-144)/(72-80) 143/80 mmHg (12/16 0446) SpO2:  [95 %-98 %] 98 % (12/16 0446) FiO2 (%):  [97 %] 97 % (12/15 0852) Weight:  [259 lb 8 oz (117.708 kg)] 259 lb 8 oz (117.708 kg) (12/16 0446)   Intake/Output Summary (Last 24 hours) at 06/13/12 0830 Last data filed at 06/12/12 1121  Gross per 24 hour  Intake      0 ml  Output    400 ml  Net   -400 ml       General appearance: alert, cooperative and no distress Heart: regular rate and rhythm Lungs: clear to auscultation bilaterally Abdomen: benign Extremities: race edema Wound: healing well, some numbness left thumb  Lab Results:  Basename 06/11/12 0700  NA 136  K 4.3  CL 96  CO2 29  GLUCOSE 130*  BUN 14  CREATININE 0.70  CALCIUM 8.5  MG --  PHOS --   No results found for this basename: AST:2,ALT:2,ALKPHOS:2,BILITOT:2,PROT:2,ALBUMIN:2 in the last 72 hours No results found for this basename: LIPASE:2,AMYLASE:2 in the last 72 hours  Basename 06/11/12 0700  WBC 9.8  NEUTROABS --  HGB 9.2*  HCT 28.0*  MCV 84.8  PLT 130*   No results found for this basename: CKTOTAL:4,CKMB:4,TROPONINI:4 in the last 72 hours No components found with this basename: POCBNP:3 No results found for this basename: DDIMER in the last 72 hours No results found for this basename: HGBA1C in the last 72 hours No results found for this basename: CHOL,HDL,LDLCALC,TRIG,CHOLHDL in the last 72 hours No results found for this basename: TSH,T4TOTAL,FREET3,T3FREE,THYROIDAB in the last 72 hours No results found for  this basename: VITAMINB12,FOLATE,FERRITIN,TIBC,IRON,RETICCTPCT in the last 72 hours  Medications: Scheduled    . acetaminophen  1,000 mg Oral Q6H  . aspirin EC  325 mg Oral Daily  . atorvastatin  20 mg Oral q1800  . bisacodyl  10 mg Oral Daily   Or  . bisacodyl  10 mg Rectal Daily  . buPROPion  100 mg Oral BID  . docusate sodium  200 mg Oral Daily  . furosemide  40 mg Oral BID  . insulin aspart  0-24 Units Subcutaneous TID AC & HS  . insulin glargine  36 Units Subcutaneous BID  . linagliptin  2.5 mg Oral BID WC  . losartan  50 mg Oral Daily  . metFORMIN  500 mg Oral BID WC  . metoprolol  25 mg Oral BID  . niacin  500 mg Oral BID  . pantoprazole  40 mg Oral Daily  . potassium chloride  20 mEq Oral BID  . sertraline  100 mg Oral Daily  . sodium chloride  3 mL Intravenous Q12H  . venlafaxine XR  37.5 mg Oral Daily     Radiology/Studies:  Dg Chest 2 View  06/13/2012  *RADIOLOGY REPORT*  Clinical Data: CABG  CHEST - 2 VIEW  Comparison: 06/11/2012  Findings: Percutaneous pacemaker wires removed. Bibasilar atelectasis and pleural effusions right greater than left stable. No pneumothorax.  IMPRESSION: Stable bibasilar atelectasis and bilateral pleural effusions right greater than left.   Original Report Authenticated By: Jolaine Click, M.D.     INR: Will add last result for INR, ABG once components are confirmed Will add last 4 CBG results once components are confirmed  Assessment/Plan: S/P Procedure(s) (LRB): CORONARY ARTERY BYPASS GRAFTING (CABG) (N/A) RADIAL ARTERY HARVEST (Left)  1. Conts.  to do well , need to wean O2 off 2 cbg's rel high at times, increase lantus to 40 bid 3 cont diuresis/pulm toilet   LOS: 5 days    Trevor Jackson,Trevor Jackson 12/16/20138:30 AM    I have seen and examined the patient and agree with the assessment and plan as outlined.  Possibly ready for d/c home in 1-2 days if stable off O2 and CBG's under good control.  Discussed the need for attention to  diabetes management at home.  Will ask for referral to outpatient diabetes management clinic.  Trevor Jackson 06/13/2012 8:45 AM

## 2012-06-13 NOTE — Progress Notes (Signed)
CARDIAC REHAB PHASE I   PRE:  Rate/Rhythm: 93SR  BP:  Supine:   Sitting: 147/87  Standing:    SaO2: pt took self off 2L---90%RA  MODE:  Ambulation: 700 ft   POST:  Rate/Rhythem: 102ST  BP:  Supine:   Sitting: 134/85  Standing:    SaO2: 85%RA, 88-91%2L [id number] Pt ready to walk. Very positive. Walked 775ft with rolling walker. Started out on RA but pt desat to 85% at about 50 ft. Put on 2L to keep sats 88-91%. Pt stated he felt SOB at 85%. Tolerated walk well otherwise. To recliner after walk. Left on 2L. Encouraged IS.  Duanne Limerick

## 2012-06-13 NOTE — Progress Notes (Signed)
UR Completed.  Vonita Calloway Jane [phone] 06/13/2012  

## 2012-06-14 LAB — GLUCOSE, CAPILLARY: Glucose-Capillary: 121 mg/dL — ABNORMAL HIGH (ref 70–99)

## 2012-06-14 MED ORDER — LOSARTAN POTASSIUM 100 MG PO TABS: 100.0000 mg | ORAL_TABLET | Freq: Every day | ORAL | Status: AC

## 2012-06-14 MED ORDER — ROSUVASTATIN CALCIUM 40 MG PO TABS: 40.0000 mg | ORAL_TABLET | Freq: Two times a day (BID) | ORAL | Status: DC

## 2012-06-14 MED ORDER — LOSARTAN POTASSIUM 50 MG PO TABS
100.0000 mg | ORAL_TABLET | Freq: Every day | ORAL | Status: DC
  Administered 2012-06-14: 100 mg via ORAL
  Filled 2012-06-14 (×2): qty 2

## 2012-06-14 NOTE — Discharge Summary (Signed)
[address]            [place] [address]          [phone]         Discharge Summary  Name: Trevor Jackson DOB: 17-Aug-1960 51 y.o. MRN: [id number]   Admission Date: 06/08/2012 Discharge Date: 06/14/2012    Admitting Diagnosis: Severe 3 vessel coronary artery disease   Discharge Diagnosis:  Severe 3 vessel coronary artery disease Expected postoperative blood loss anemia  Past Medical History  Diagnosis Date  . Diabetes mellitus   . Benign head tremor   . OSA (obstructive sleep apnea)   . Anxiety   . Coronary artery disease 05/18/2012  . Morbid obesity with BMI of 40.0-44.9, adult 05/18/2012  . Hyperlipidemia   . Headache 05/18/2012  . Anxiety and depression 05/18/2012  . Obstructive sleep apnea 05/18/2012    On CPAP  . Myocardial infarction 01/07/2011  . Hypertension     dr berry  . 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 male with a known history of coronary artery disease, status post acute inferior wall myocardial infarction in July of 2012. The patient was treated with PCI and stenting of the right coronary artery using a drug-eluting stent at that time. The patient states that he did fairly well initially after he recovered from his myocardial infarction in 2012. However, he since then has developed recurrent angina pectoris which has accelerated considerably over the last 2 months. The patient describes classical symptoms of substernal chest tightness brought on with physical activity and relieved by rest or sublingual nitroglycerin. These symptoms currently are her  nearly every day or brought on with mild activity and occasionally occurring at rest despite the fact that the patient is currently on maximal 3-drug medical therapy. Symptoms are always promptly relieved with administration of sublingual nitroglycerin. Chest tightness is associated with shortness breath and diaphoresis. The patient describes chronic exertional shortness of breath otherwise which has not changed substantially over time. He denies resting shortness of breath, PND, orthopnea, lower extremity edema, palpitations, or syncope. The patient was recently seen in followup at the [facility] and scheduled for cardiac catheterization that was performed by Dr. Herbie Baltimore. This demonstrated severe three-vessel coronary artery disease with preserved left ventricular function. The patient was referred to Dr. Cornelius Moras to consider elective surgical revascularization. He was originally seen in consultation on 05/18/2012. Since then he has remained clinically stable and returned to discuss surgery.  It was felt that he would benefit from CABG at this time.  All risks, benefits and alternatives of surgery were explained in detail, and the patient agreed to proceed.     Hospital Course:  The patient was admitted to [facility] on 06/08/2012. The patient was taken to the operating room and underwent the above procedure.    The postoperative course has generally been uneventful. He has been somewhat volume overloaded and has been diuresed with Lasix.  He was started back on his  home diabetes medications, and his blood sugars have been well controlled.  He will need outpatient follow up for hemoglobin A1C= 11.1.  His blood pressures have been elevated postop and home antihypertensives have been resumed.  He is ambulating in the halls without difficulty.  He is tolerating a regular diet.  He remains in sinus rhythm.  He was evaluated on today's date and is ready for discharge home.    Recent  vital signs:  Filed Vitals:   06/14/12 0516  BP: 128/79  Pulse:   Temp:   Resp:     Recent laboratory studies:  CBC:No results found for this basename: WBC:2,HGB:2,HCT:2,PLT:2 in the last 72 hours BMET: No results found for this basename: NA:2,K:2,CL:2,CO2:2,GLUCOSE:2,BUN:2,CREATININE:2,CALCIUM:2 in the last 72 hours  PT/INR: No results found for this basename: LABPROT,INR in the last 72 hours     Discharge Medications:     Medication List     As of 06/14/2012  9:51 AM    STOP taking these medications         amLODipine 2.5 MG tablet   Commonly known as: NORVASC      aspirin 81 MG chewable tablet      isosorbide mononitrate 60 MG 24 hr tablet   Commonly known as: IMDUR      nitroGLYCERIN 0.4 MG SL tablet   Commonly known as: NITROSTAT      TAKE these medications         aspirin 325 MG EC tablet   Take 1 tablet (325 mg total) by mouth daily.      buPROPion 100 MG tablet   Commonly known as: WELLBUTRIN   Take 100 mg by mouth 2 (two) times daily.      BYDUREON 2 MG Susr   Generic drug: Exenatide   Inject 2 mg into the skin once a week.      EFFEXOR XR 37.5 MG 24 hr capsule   Generic drug: venlafaxine XR   Take 37.5 mg by mouth daily.      furosemide 40 MG tablet   Commonly known as: LASIX   Take 1 tablet (40 mg total) by mouth daily.      insulin glargine 100 UNIT/ML injection   Commonly known as: LANTUS   Inject 40 Units into the skin 2 (two) times daily.      Linagliptin-Metformin HCl 2.5-500 MG Tabs   Take 1 tablet by mouth 2 (two) times daily.      losartan 100 MG tablet   Commonly known as: COZAAR   Take 1 tablet (100 mg total) by mouth daily.      metoprolol tartrate 25 MG tablet   Commonly known as: LOPRESSOR   Take 1 tablet (25 mg total) by mouth 2 (two) times daily.      multivitamins ther. w/minerals Tabs   Take 1 tablet by mouth daily.      niacin 500 MG CR tablet   Commonly known as: NIASPAN   Take 500 mg by mouth 2 (two) times  daily.      oxyCODONE 5 MG immediate release tablet   Commonly known as: Oxy IR/ROXICODONE   Take 1-2 tablets (5-10 mg total) by mouth every 4 (four) hours as needed.      potassium chloride SA 20 MEQ tablet   Commonly known as: K-DUR,KLOR-CON   Take 1 tablet (20 mEq total) by mouth 2 (two) times daily.      rosuvastatin 40 MG tablet   Commonly known as: CRESTOR  Take 1 tablet (40 mg total) by mouth 2 (two) times daily.      sertraline 100 MG tablet   Commonly known as: ZOLOFT   Take 100 mg by mouth daily.      VENTOLIN HFA 108 (90 BASE) MCG/ACT inhaler   Generic drug: albuterol   Inhale 2 puffs into the lungs every 6 (six) hours as needed. For shortness of breath      vitamin C 500 MG tablet   Commonly known as: ASCORBIC ACID   Take 500 mg by mouth daily.      VITAMIN D PO   Take 1 tablet by mouth daily.      zolpidem 10 MG tablet   Commonly known as: AMBIEN   Take 10 mg by mouth at bedtime as needed. For sleep         Discharge Instructions:  The patient is to refrain from driving, heavy lifting or strenuous activity.  May shower daily and clean incisions with soap and water.  May resume regular diet.    Follow Up:      Discharge Orders    Future Appointments: Provider: Department: Dept Phone: Center:   07/04/2012 1:30 PM [facility] [place] [phone] [facility]     Future Orders Please Complete By Expires   Ambulatory referral to Nutrition and Diabetic Education      Comments:   A1C=11.1      Follow-up Information    Follow up with Purcell Nails, MD. (07/04/2012 at 1:30 PM with the PA at Dr. Orvan July office. Please obtain a chest x-ray at [facility] imaging at 12:30 PM. [facility] imaging is located in the same office complex)    Contact information:   [address] [place] [address] [phone]       Follow up with Runell Gess, MD. (2 weeks-please contact his office to arrange  this appointment)    Contact information:   [address] [place] [address] [phone]           Adella Hare 06/14/2012, 9:51 AM

## 2012-06-14 NOTE — Discharge Summary (Signed)
I agree with the above discharge summary and plan for follow-up.  OWEN,CLARENCE H  

## 2012-06-14 NOTE — Progress Notes (Addendum)
                    [address]            [place] [address]          [phone]     6 Days Post-Op Procedure(s) (LRB): CORONARY ARTERY BYPASS GRAFTING (CABG) (N/A) RADIAL ARTERY HARVEST (Left)  Subjective: Feels well, no complaints.  Ready to go home.   Objective: Vital signs in last 24 hours: Patient Vitals for the past 24 hrs:  BP Temp Temp src Pulse Resp SpO2  06/14/12 0516 128/79 mmHg - - - - -  06/14/12 0500 155/81 mmHg 97.3 F (36.3 C) Oral 90  - 100 %  06/13/12 1921 112/64 mmHg 97.4 F (36.3 C) Oral 90  - 99 %  06/13/12 1500 126/77 mmHg 98.5 F (36.9 C) Oral 83  18  100 %   Current Weight  06/13/12 259 lb 8 oz (117.708 kg)     Intake/Output from previous day: 12/16 0701 - 12/17 0700 In: 360 [P.O.:360] Out: -   CBGs 136-212-121   PHYSICAL EXAM:  Heart: RRR Lungs: Clear Wound: Clean and dry Extremities: Mild LE edema, L arm warm, well perfused    Lab Results: CBC:No results found for this basename: WBC:2,HGB:2,HCT:2,PLT:2 in the last 72 hours BMET: No results found for this basename: NA:2,K:2,CL:2,CO2:2,GLUCOSE:2,BUN:2,CREATININE:2,CALCIUM:2 in the last 72 hours  PT/INR: No results found for this basename: LABPROT,INR in the last 72 hours    Assessment/Plan: S/P Procedure(s) (LRB): CORONARY ARTERY BYPASS GRAFTING (CABG) (N/A) RADIAL ARTERY HARVEST (Left) CV- BPs better, SR. Continue current meds. DM- sugars fairly well controlled. Plan home today- instructions reviewed with patient.   LOS: 6 days    COLLINS,GINA H 06/14/2012   I have seen and examined the patient and agree with the assessment and plan as outlined.  D/C home today.  Increase Cozaar to previous home dose.  Continue lantus insulin 40 units bid.  Patient instructed to keep log of CBG's at home, at least bid.  Keilana Morlock H 06/14/2012 9:03 AM

## 2012-06-14 NOTE — Progress Notes (Signed)
[phone] Pt states he has been on RA all night and walked on RA. Stated his breathing was better after his nose was cleared of blood clots. Education completed with understanding voice. Encouraged IS and flutter valve. Pt gave permission to refer to [facility] Phase 2. Put on d/c video for pt and wife to view. Ramari Bray DunlapRN

## 2012-06-15 ENCOUNTER — Other Ambulatory Visit: Payer: Self-pay | Admitting: *Deleted

## 2012-06-15 ENCOUNTER — Other Ambulatory Visit: Payer: Self-pay | Admitting: Physician Assistant

## 2012-06-15 DIAGNOSIS — I2510 Atherosclerotic heart disease of native coronary artery without angina pectoris: Secondary | ICD-10-CM

## 2012-06-15 MED ORDER — POTASSIUM CHLORIDE CRYS ER 20 MEQ PO TBCR: 20.0000 meq | EXTENDED_RELEASE_TABLET | Freq: Two times a day (BID) | ORAL | Status: DC

## 2012-06-15 MED ORDER — FUROSEMIDE 40 MG PO TABS: 40.0000 mg | ORAL_TABLET | Freq: Every day | ORAL | Status: DC

## 2012-06-20 ENCOUNTER — Ambulatory Visit
Admission: RE | Admit: 2012-06-20 | Discharge: 2012-06-20 | Disposition: A | Discharge status: Home / Self Care | Payer: BC Managed Care – PPO | Source: Ambulatory Visit | Attending: Thoracic Surgery (Cardiothoracic Vascular Surgery) | Admitting: Thoracic Surgery (Cardiothoracic Vascular Surgery)

## 2012-06-20 ENCOUNTER — Ambulatory Visit (INDEPENDENT_AMBULATORY_CARE_PROVIDER_SITE_OTHER): Payer: Self-pay | Admitting: Surgical

## 2012-06-20 ENCOUNTER — Other Ambulatory Visit: Payer: Self-pay | Admitting: *Deleted

## 2012-06-20 VITALS — BP 90/63 | HR 90 | Resp 22 | Ht 65.0 in | Wt 238.0 lb

## 2012-06-20 DIAGNOSIS — R071 Chest pain on breathing: Secondary | ICD-10-CM

## 2012-06-20 DIAGNOSIS — Z951 Presence of aortocoronary bypass graft: Secondary | ICD-10-CM

## 2012-06-20 DIAGNOSIS — R0602 Shortness of breath: Secondary | ICD-10-CM

## 2012-06-20 MED ORDER — OXYCODONE HCL 5 MG PO TABS: 5.0000 mg | ORAL_TABLET | ORAL | Status: DC | PRN
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
## 2012-06-20 NOTE — Patient Instructions (Signed)
Patient given instructions verbally regarding ongoing rehabilitation and monitoring of incisions.

## 2012-06-30 ENCOUNTER — Other Ambulatory Visit: Payer: Self-pay | Admitting: *Deleted

## 2012-06-30 DIAGNOSIS — I2510 Atherosclerotic heart disease of native coronary artery without angina pectoris: Secondary | ICD-10-CM

## 2012-07-01 ENCOUNTER — Other Ambulatory Visit: Payer: Self-pay | Admitting: *Deleted

## 2012-07-01 ENCOUNTER — Other Ambulatory Visit (HOSPITAL_COMMUNITY): Payer: Self-pay | Admitting: Cardiovascular Disease

## 2012-07-01 DIAGNOSIS — I2510 Atherosclerotic heart disease of native coronary artery without angina pectoris: Secondary | ICD-10-CM

## 2012-07-01 DIAGNOSIS — Z951 Presence of aortocoronary bypass graft: Secondary | ICD-10-CM

## 2012-07-01 DIAGNOSIS — G8918 Other acute postprocedural pain: Secondary | ICD-10-CM

## 2012-07-01 MED ORDER — HYDROCODONE-ACETAMINOPHEN 7.5-325 MG PO TABS: 1.0000 | ORAL_TABLET | Freq: Four times a day (QID) | ORAL | Status: DC | PRN

## 2012-07-04 ENCOUNTER — Ambulatory Visit (INDEPENDENT_AMBULATORY_CARE_PROVIDER_SITE_OTHER): Payer: Self-pay | Admitting: Thoracic Surgery (Cardiothoracic Vascular Surgery)

## 2012-07-04 ENCOUNTER — Ambulatory Visit
Admission: RE | Admit: 2012-07-04 | Discharge: 2012-07-04 | Disposition: A | Discharge status: Home / Self Care | Payer: BC Managed Care – PPO | Source: Ambulatory Visit | Attending: Thoracic Surgery (Cardiothoracic Vascular Surgery) | Admitting: Thoracic Surgery (Cardiothoracic Vascular Surgery)

## 2012-07-04 ENCOUNTER — Ambulatory Visit: Payer: BC Managed Care – PPO

## 2012-07-04 ENCOUNTER — Encounter: Payer: Self-pay | Admitting: Thoracic Surgery (Cardiothoracic Vascular Surgery)

## 2012-07-04 VITALS — BP 100/68 | HR 71 | Resp 16 | Ht 65.0 in | Wt 249.0 lb

## 2012-07-04 DIAGNOSIS — I2510 Atherosclerotic heart disease of native coronary artery without angina pectoris: Secondary | ICD-10-CM

## 2012-07-04 DIAGNOSIS — E119 Type 2 diabetes mellitus without complications: Secondary | ICD-10-CM

## 2012-07-04 DIAGNOSIS — Z951 Presence of aortocoronary bypass graft: Secondary | ICD-10-CM
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
## 2012-07-04 NOTE — Patient Instructions (Signed)
The patient should continue to avoid any heavy lifting or strenuous use of arms or shoulders for at least a total of three months from the time of surgery. The patient may return to driving an automobile as long as they are no longer requiring oral narcotic pain relievers during the daytime.  It would be wise to start driving only short distances during the daylight and gradually increase from there as they feel comfortable. The patient should continue to work very hard to keep diabetes under tight control.

## 2012-07-11 ENCOUNTER — Other Ambulatory Visit: Payer: Self-pay | Admitting: *Deleted

## 2012-07-11 DIAGNOSIS — G8918 Other acute postprocedural pain: Secondary | ICD-10-CM

## 2012-07-11 MED ORDER — HYDROCODONE-ACETAMINOPHEN 7.5-325 MG PO TABS: 1.0000 | ORAL_TABLET | ORAL | Status: DC | PRN

## 2012-08-01 ENCOUNTER — Other Ambulatory Visit: Payer: Self-pay

## 2012-08-01 DIAGNOSIS — G8918 Other acute postprocedural pain: Secondary | ICD-10-CM
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
## 2012-08-01 MED ORDER — HYDROCODONE-ACETAMINOPHEN 7.5-325 MG PO TABS: 1.0000 | ORAL_TABLET | ORAL | Status: DC | PRN

## 2012-08-01 NOTE — Telephone Encounter (Signed)
RX for Norco 7.5/500 mg #40/ no refills called to pt's pharm

## 2012-08-04 ENCOUNTER — Encounter (HOSPITAL_COMMUNITY)
Admission: RE | Admit: 2012-08-04 | Discharge: 2012-08-04 | Disposition: A | Discharge status: Home / Self Care | Payer: BC Managed Care – PPO | Source: Ambulatory Visit | Attending: Cardiology | Admitting: Cardiology

## 2012-08-04 DIAGNOSIS — I2510 Atherosclerotic heart disease of native coronary artery without angina pectoris: Secondary | ICD-10-CM | POA: Insufficient documentation

## 2012-08-04 DIAGNOSIS — I10 Essential (primary) hypertension: Secondary | ICD-10-CM | POA: Insufficient documentation

## 2012-08-04 DIAGNOSIS — Z5189 Encounter for other specified aftercare: Secondary | ICD-10-CM | POA: Insufficient documentation

## 2012-08-04 DIAGNOSIS — I252 Old myocardial infarction: Secondary | ICD-10-CM | POA: Insufficient documentation

## 2012-08-04 DIAGNOSIS — E119 Type 2 diabetes mellitus without complications: Secondary | ICD-10-CM | POA: Insufficient documentation

## 2012-08-04 NOTE — Progress Notes (Signed)
Cardiac Rehab Medication Review by a Pharmacist  Does the patient  feel that his/her medications are working for him/her?  yes  Has the patient been experiencing any side effects to the medications prescribed?  no  Does the patient measure his/her own blood pressure or blood glucose at home?  yes (blood sugar)   Does the patient have any problems obtaining medications due to transportation or finances?   no  Understanding of regimen: fair Understanding of indications: fair Potential of compliance: good   Trevor Jackson 08/04/2012 8:42 AM

## 2012-08-08 ENCOUNTER — Encounter (HOSPITAL_COMMUNITY)
Admission: RE | Admit: 2012-08-08 | Discharge: 2012-08-08 | Disposition: A | Discharge status: Home / Self Care | Payer: BC Managed Care – PPO | Source: Ambulatory Visit | Attending: Cardiovascular Disease | Admitting: Cardiovascular Disease

## 2012-08-08 LAB — GLUCOSE, CAPILLARY: Glucose-Capillary: 135 mg/dL — ABNORMAL HIGH (ref 70–99)

## 2012-08-08 NOTE — Progress Notes (Signed)
Pt started cardiac rehab today for 1115 exercise clas.  Pt tolerated light exercise without difficulty.Monitor shows Sr with no noted ectopy.

## 2012-08-10 ENCOUNTER — Encounter (HOSPITAL_COMMUNITY)
Admission: RE | Admit: 2012-08-10 | Discharge: 2012-08-10 | Disposition: A | Discharge status: Home / Self Care | Payer: BC Managed Care – PPO | Source: Ambulatory Visit | Attending: Cardiovascular Disease | Admitting: Cardiovascular Disease

## 2012-08-12 ENCOUNTER — Encounter (HOSPITAL_COMMUNITY): Payer: BC Managed Care – PPO

## 2012-08-15 ENCOUNTER — Encounter (HOSPITAL_COMMUNITY)
Admission: RE | Admit: 2012-08-15 | Discharge: 2012-08-15 | Disposition: A | Discharge status: Home / Self Care | Payer: BC Managed Care – PPO | Source: Ambulatory Visit | Attending: Cardiovascular Disease | Admitting: Cardiovascular Disease

## 2012-08-15 NOTE — Progress Notes (Signed)
Reviewed home exercise with pt today.  Pt plans to walk and use stepper at home for exercise.  Reviewed THR, pulse, RPE, sign and symptoms, and when to call 911 or MD.  Pt voiced understanding. Fabio Pierce, MA, ACSM RCEP

## 2012-08-17 ENCOUNTER — Encounter (HOSPITAL_COMMUNITY)
Admission: RE | Admit: 2012-08-17 | Discharge: 2012-08-17 | Disposition: A | Discharge status: Home / Self Care | Payer: BC Managed Care – PPO | Source: Ambulatory Visit | Attending: Cardiovascular Disease | Admitting: Cardiovascular Disease

## 2012-08-17 LAB — GLUCOSE, CAPILLARY: Glucose-Capillary: 168 mg/dL — ABNORMAL HIGH (ref 70–99)

## 2012-08-19 ENCOUNTER — Encounter (HOSPITAL_COMMUNITY)
Admission: RE | Admit: 2012-08-19 | Discharge: 2012-08-19 | Disposition: A | Discharge status: Home / Self Care | Payer: BC Managed Care – PPO | Source: Ambulatory Visit | Attending: Cardiovascular Disease | Admitting: Cardiovascular Disease

## 2012-08-19 LAB — GLUCOSE, CAPILLARY: Glucose-Capillary: 199 mg/dL — ABNORMAL HIGH (ref 70–99)

## 2012-08-22 ENCOUNTER — Encounter (HOSPITAL_COMMUNITY)
Admission: RE | Admit: 2012-08-22 | Discharge: 2012-08-22 | Disposition: A | Discharge status: Home / Self Care | Payer: BC Managed Care – PPO | Source: Ambulatory Visit | Attending: Cardiovascular Disease | Admitting: Cardiovascular Disease

## 2012-08-24 ENCOUNTER — Encounter (HOSPITAL_COMMUNITY)
Admission: RE | Admit: 2012-08-24 | Discharge: 2012-08-24 | Disposition: A | Discharge status: Home / Self Care | Payer: BC Managed Care – PPO | Source: Ambulatory Visit | Attending: Cardiovascular Disease | Admitting: Cardiovascular Disease

## 2012-08-24 NOTE — Progress Notes (Addendum)
Pt in today for 1115 class.  Reviewed QOL survey results.  Pt attributes low scores in family area due to moving away from all his family to move to [place] due to work.  Pt states that he had a circle of fiends that he associated with but no longer does due to unreciprocated relationship. Counseling provided.  Pt declined offer for further treatment.

## 2012-08-26 ENCOUNTER — Encounter (HOSPITAL_COMMUNITY): Payer: BC Managed Care – PPO

## 2012-08-29 ENCOUNTER — Encounter (HOSPITAL_COMMUNITY)
Admission: RE | Admit: 2012-08-29 | Discharge: 2012-08-29 | Disposition: A | Discharge status: Home / Self Care | Payer: BC Managed Care – PPO | Source: Ambulatory Visit | Attending: Cardiovascular Disease | Admitting: Cardiovascular Disease

## 2012-08-29 DIAGNOSIS — E119 Type 2 diabetes mellitus without complications: Secondary | ICD-10-CM | POA: Insufficient documentation

## 2012-08-29 DIAGNOSIS — Z5189 Encounter for other specified aftercare: Secondary | ICD-10-CM | POA: Insufficient documentation

## 2012-08-29 DIAGNOSIS — I2510 Atherosclerotic heart disease of native coronary artery without angina pectoris: Secondary | ICD-10-CM | POA: Insufficient documentation

## 2012-08-29 DIAGNOSIS — I10 Essential (primary) hypertension: Secondary | ICD-10-CM | POA: Insufficient documentation

## 2012-08-29 DIAGNOSIS — I252 Old myocardial infarction: Secondary | ICD-10-CM | POA: Insufficient documentation

## 2012-08-31 ENCOUNTER — Encounter (HOSPITAL_COMMUNITY)
Admission: RE | Admit: 2012-08-31 | Discharge: 2012-08-31 | Disposition: A | Discharge status: Home / Self Care | Payer: BC Managed Care – PPO | Source: Ambulatory Visit | Attending: Cardiovascular Disease | Admitting: Cardiovascular Disease

## 2012-08-31 ENCOUNTER — Encounter (HOSPITAL_COMMUNITY): Payer: BC Managed Care – PPO

## 2012-09-02 ENCOUNTER — Encounter (HOSPITAL_COMMUNITY): Payer: BC Managed Care – PPO

## 2012-09-05 ENCOUNTER — Encounter (HOSPITAL_COMMUNITY): Payer: BC Managed Care – PPO

## 2012-09-05 ENCOUNTER — Other Ambulatory Visit: Payer: Self-pay | Admitting: Physician Assistant

## 2012-09-05 ENCOUNTER — Telehealth (HOSPITAL_COMMUNITY): Payer: Self-pay | Admitting: *Deleted
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
## 2012-09-07 ENCOUNTER — Encounter (HOSPITAL_COMMUNITY)
Admission: RE | Admit: 2012-09-07 | Discharge: 2012-09-07 | Disposition: A | Discharge status: Home / Self Care | Payer: BC Managed Care – PPO | Source: Ambulatory Visit | Attending: Cardiovascular Disease | Admitting: Cardiovascular Disease
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
## 2012-09-09 ENCOUNTER — Encounter (HOSPITAL_COMMUNITY)
Admission: RE | Admit: 2012-09-09 | Discharge: 2012-09-09 | Disposition: A | Discharge status: Home / Self Care | Payer: BC Managed Care – PPO | Source: Ambulatory Visit | Attending: Cardiovascular Disease | Admitting: Cardiovascular Disease

## 2012-09-09 NOTE — Progress Notes (Signed)
Nutrition Note Pt brought a copy of his lipids from 07/12/12. Lipid values now WNL. A1c decreased to 8.1, which is significantly improved from 06/06/12 A1c of 11.1.Continue client-centered nutrition education by RD as part of interdisciplinary care.  Monitor and evaluate progress toward nutrition goal with team. Mickle Plumb, M.Ed, RD, LDN, CDE 09/09/2012 11:37 AM

## 2012-09-12 ENCOUNTER — Encounter (HOSPITAL_COMMUNITY)
Admission: RE | Admit: 2012-09-12 | Discharge: 2012-09-12 | Disposition: A | Discharge status: Home / Self Care | Payer: BC Managed Care – PPO | Source: Ambulatory Visit | Attending: Cardiovascular Disease | Admitting: Cardiovascular Disease

## 2012-09-12 LAB — GLUCOSE, CAPILLARY: Glucose-Capillary: 246 mg/dL — ABNORMAL HIGH (ref 70–99)

## 2012-09-14 ENCOUNTER — Encounter (HOSPITAL_COMMUNITY)
Admission: RE | Admit: 2012-09-14 | Discharge: 2012-09-14 | Disposition: A | Discharge status: Home / Self Care | Payer: BC Managed Care – PPO | Source: Ambulatory Visit | Attending: Cardiovascular Disease | Admitting: Cardiovascular Disease

## 2012-09-16 ENCOUNTER — Encounter (HOSPITAL_COMMUNITY)
Admission: RE | Admit: 2012-09-16 | Discharge: 2012-09-16 | Disposition: A | Discharge status: Home / Self Care | Payer: BC Managed Care – PPO | Source: Ambulatory Visit | Attending: Cardiovascular Disease | Admitting: Cardiovascular Disease

## 2012-09-19 ENCOUNTER — Encounter (HOSPITAL_COMMUNITY)
Admission: RE | Admit: 2012-09-19 | Discharge: 2012-09-19 | Disposition: A | Discharge status: Home / Self Care | Payer: BC Managed Care – PPO | Source: Ambulatory Visit | Attending: Cardiovascular Disease | Admitting: Cardiovascular Disease

## 2012-09-21 ENCOUNTER — Encounter (HOSPITAL_COMMUNITY)
Admission: RE | Admit: 2012-09-21 | Discharge: 2012-09-21 | Disposition: A | Discharge status: Home / Self Care | Payer: BC Managed Care – PPO | Source: Ambulatory Visit | Attending: Cardiovascular Disease | Admitting: Cardiovascular Disease

## 2012-09-23 ENCOUNTER — Encounter (HOSPITAL_COMMUNITY)
Admission: RE | Admit: 2012-09-23 | Discharge: 2012-09-23 | Disposition: A | Discharge status: Home / Self Care | Payer: BC Managed Care – PPO | Source: Ambulatory Visit | Attending: Cardiovascular Disease | Admitting: Cardiovascular Disease

## 2012-09-26 ENCOUNTER — Encounter (HOSPITAL_COMMUNITY)
Admission: RE | Admit: 2012-09-26 | Discharge: 2012-09-26 | Disposition: A | Discharge status: Home / Self Care | Payer: BC Managed Care – PPO | Source: Ambulatory Visit | Attending: Cardiovascular Disease | Admitting: Cardiovascular Disease

## 2012-09-28 ENCOUNTER — Encounter (HOSPITAL_COMMUNITY): Admission: RE | Admit: 2012-09-28 | Payer: Medicare Other | Source: Ambulatory Visit

## 2012-09-29 ENCOUNTER — Telehealth (HOSPITAL_COMMUNITY): Payer: Self-pay | Admitting: *Deleted

## 2012-09-29 NOTE — Telephone Encounter (Signed)
Pt called. Will be absent today 4/2 due to legs burning.  Pt plans to go see PCP.

## 2012-09-30 ENCOUNTER — Encounter (HOSPITAL_COMMUNITY)
Admission: RE | Admit: 2012-09-30 | Discharge: 2012-09-30 | Disposition: A | Discharge status: Home / Self Care | Payer: Medicare Other | Source: Ambulatory Visit | Attending: Cardiovascular Disease | Admitting: Cardiovascular Disease

## 2012-09-30 DIAGNOSIS — I10 Essential (primary) hypertension: Secondary | ICD-10-CM | POA: Insufficient documentation

## 2012-09-30 DIAGNOSIS — I252 Old myocardial infarction: Secondary | ICD-10-CM | POA: Insufficient documentation

## 2012-09-30 DIAGNOSIS — E119 Type 2 diabetes mellitus without complications: Secondary | ICD-10-CM | POA: Insufficient documentation

## 2012-09-30 DIAGNOSIS — Z5189 Encounter for other specified aftercare: Secondary | ICD-10-CM | POA: Insufficient documentation

## 2012-09-30 DIAGNOSIS — I2510 Atherosclerotic heart disease of native coronary artery without angina pectoris: Secondary | ICD-10-CM | POA: Insufficient documentation

## 2012-10-03 ENCOUNTER — Encounter (HOSPITAL_COMMUNITY)
Admission: RE | Admit: 2012-10-03 | Discharge: 2012-10-03 | Disposition: A | Discharge status: Home / Self Care | Payer: Medicare Other | Source: Ambulatory Visit | Attending: Cardiovascular Disease | Admitting: Cardiovascular Disease

## 2012-10-05 ENCOUNTER — Encounter (HOSPITAL_COMMUNITY)
Admission: RE | Admit: 2012-10-05 | Discharge: 2012-10-05 | Disposition: A | Discharge status: Home / Self Care | Payer: Medicare Other | Source: Ambulatory Visit | Attending: Cardiovascular Disease | Admitting: Cardiovascular Disease

## 2012-10-07 ENCOUNTER — Encounter (HOSPITAL_COMMUNITY)
Admission: RE | Admit: 2012-10-07 | Discharge: 2012-10-07 | Disposition: A | Discharge status: Home / Self Care | Payer: Medicare Other | Source: Ambulatory Visit | Attending: Cardiovascular Disease | Admitting: Cardiovascular Disease

## 2012-10-10 ENCOUNTER — Encounter (HOSPITAL_COMMUNITY)
Admission: RE | Admit: 2012-10-10 | Discharge: 2012-10-10 | Disposition: A | Discharge status: Home / Self Care | Payer: Medicare Other | Source: Ambulatory Visit | Attending: Cardiovascular Disease | Admitting: Cardiovascular Disease

## 2012-10-12 ENCOUNTER — Encounter (HOSPITAL_COMMUNITY)
Admission: RE | Admit: 2012-10-12 | Discharge: 2012-10-12 | Disposition: A | Discharge status: Home / Self Care | Payer: Medicare Other | Source: Ambulatory Visit | Attending: Cardiovascular Disease | Admitting: Cardiovascular Disease

## 2012-10-14 ENCOUNTER — Encounter (HOSPITAL_COMMUNITY): Payer: Medicare Other

## 2012-10-14 ENCOUNTER — Telehealth (HOSPITAL_COMMUNITY): Payer: Self-pay | Admitting: *Deleted

## 2012-10-14 NOTE — Telephone Encounter (Signed)
Pt called out today for exercise due to leg burning, sugar low, sprained lumbar.

## 2012-10-17 ENCOUNTER — Telehealth (HOSPITAL_COMMUNITY): Payer: Self-pay | Admitting: *Deleted

## 2012-10-17 ENCOUNTER — Encounter (HOSPITAL_COMMUNITY): Payer: Medicare Other

## 2012-10-17 NOTE — Telephone Encounter (Signed)
Answering machine message - Pt will be absent today from exercise.  Lower back ache.

## 2012-10-19 ENCOUNTER — Encounter (HOSPITAL_COMMUNITY)
Admission: RE | Admit: 2012-10-19 | Discharge: 2012-10-19 | Disposition: A | Discharge status: Home / Self Care | Payer: Medicare Other | Source: Ambulatory Visit | Attending: Cardiovascular Disease | Admitting: Cardiovascular Disease

## 2012-10-19 LAB — GLUCOSE, CAPILLARY: Glucose-Capillary: 122 mg/dL — ABNORMAL HIGH (ref 70–99)

## 2012-10-21 ENCOUNTER — Encounter (HOSPITAL_COMMUNITY): Payer: Medicare Other

## 2012-10-24 ENCOUNTER — Encounter (HOSPITAL_COMMUNITY): Payer: Medicare Other

## 2012-10-26 ENCOUNTER — Encounter (HOSPITAL_COMMUNITY)
Admission: RE | Admit: 2012-10-26 | Discharge: 2012-10-26 | Disposition: A | Discharge status: Home / Self Care | Payer: Medicare Other | Source: Ambulatory Visit | Attending: Cardiovascular Disease | Admitting: Cardiovascular Disease

## 2012-10-28 ENCOUNTER — Encounter (HOSPITAL_COMMUNITY): Payer: Medicare Other

## 2012-10-31 ENCOUNTER — Encounter (HOSPITAL_COMMUNITY): Payer: Medicare Other

## 2012-11-02 ENCOUNTER — Encounter (HOSPITAL_COMMUNITY): Admission: RE | Admit: 2012-11-02 | Payer: Medicare Other | Source: Ambulatory Visit

## 2012-11-04 ENCOUNTER — Encounter (HOSPITAL_COMMUNITY): Payer: Medicare Other

## 2012-11-07 ENCOUNTER — Encounter (HOSPITAL_COMMUNITY): Payer: Medicare Other

## 2012-11-09 ENCOUNTER — Encounter (HOSPITAL_COMMUNITY): Payer: Medicare Other

## 2012-11-11 ENCOUNTER — Encounter (HOSPITAL_COMMUNITY): Payer: Medicare Other

## 2012-11-14 ENCOUNTER — Encounter (HOSPITAL_COMMUNITY)
Admission: RE | Admit: 2012-11-14 | Discharge: 2012-11-14 | Disposition: A | Discharge status: Home / Self Care | Payer: Medicare Other | Source: Ambulatory Visit | Attending: Cardiovascular Disease | Admitting: Cardiovascular Disease

## 2012-11-14 DIAGNOSIS — I10 Essential (primary) hypertension: Secondary | ICD-10-CM | POA: Insufficient documentation

## 2012-11-14 DIAGNOSIS — E119 Type 2 diabetes mellitus without complications: Secondary | ICD-10-CM | POA: Insufficient documentation

## 2012-11-14 DIAGNOSIS — I2510 Atherosclerotic heart disease of native coronary artery without angina pectoris: Secondary | ICD-10-CM | POA: Insufficient documentation

## 2012-11-14 DIAGNOSIS — Z5189 Encounter for other specified aftercare: Secondary | ICD-10-CM | POA: Insufficient documentation

## 2012-11-14 DIAGNOSIS — I252 Old myocardial infarction: Secondary | ICD-10-CM | POA: Insufficient documentation

## 2012-11-14 NOTE — Progress Notes (Signed)
Pt returned to exercise after brief absence due back pain.  Pt with new medication for diabetes.  Will monitor blood glucose levels.

## 2012-11-16 ENCOUNTER — Encounter (HOSPITAL_COMMUNITY)
Admission: RE | Admit: 2012-11-16 | Discharge: 2012-11-16 | Disposition: A | Discharge status: Home / Self Care | Payer: Medicare Other | Source: Ambulatory Visit | Attending: Cardiovascular Disease | Admitting: Cardiovascular Disease

## 2012-11-18 ENCOUNTER — Encounter (HOSPITAL_COMMUNITY)
Admission: RE | Admit: 2012-11-18 | Discharge: 2012-11-18 | Disposition: A | Discharge status: Home / Self Care | Payer: Medicare Other | Source: Ambulatory Visit | Attending: Cardiovascular Disease | Admitting: Cardiovascular Disease

## 2012-11-18 NOTE — Progress Notes (Signed)
Pt graduated today with the completion of   Sessions in Cardiac Rehab phase II.  Pt plans to continue his home exercise at local gym.  Pt with no changes in is depression score.

## 2013-02-20 ENCOUNTER — Other Ambulatory Visit: Payer: Self-pay | Admitting: *Deleted

## 2013-02-20 DIAGNOSIS — I2510 Atherosclerotic heart disease of native coronary artery without angina pectoris: Secondary | ICD-10-CM

## 2013-02-20 MED ORDER — FUROSEMIDE 40 MG PO TABS: 40.0000 mg | ORAL_TABLET | Freq: Every day | ORAL | Status: DC

## 2013-02-28 ENCOUNTER — Telehealth: Payer: Self-pay | Admitting: *Deleted

## 2013-02-28 DIAGNOSIS — R918 Other nonspecific abnormal finding of lung field: Secondary | ICD-10-CM

## 2013-02-28 NOTE — Telephone Encounter (Signed)
Message copied by Christen Butter on Tue Feb 28, 2013 12:25 PM ------      Message from: Sandrea Hughs B      Created: Thu Feb 25, 2012  2:52 PM       Ct chest due ------

## 2013-02-28 NOTE — Telephone Encounter (Signed)
LMTCB

## 2013-03-01 NOTE — Telephone Encounter (Signed)
LMTCB

## 2013-03-02 NOTE — Telephone Encounter (Signed)
Patient returning call.

## 2013-03-03 NOTE — Telephone Encounter (Signed)
LMTCB

## 2013-03-03 NOTE — Telephone Encounter (Signed)
ATC pt line busy wcb 

## 2013-03-03 NOTE — Telephone Encounter (Signed)
Pt is aware. Order sent

## 2013-03-06 ENCOUNTER — Other Ambulatory Visit: Payer: Self-pay | Admitting: *Deleted

## 2013-03-06 DIAGNOSIS — I2510 Atherosclerotic heart disease of native coronary artery without angina pectoris: Secondary | ICD-10-CM

## 2013-03-06 MED ORDER — POTASSIUM CHLORIDE CRYS ER 20 MEQ PO TBCR: 20.0000 meq | EXTENDED_RELEASE_TABLET | Freq: Two times a day (BID) | ORAL | Status: DC

## 2013-03-06 NOTE — Telephone Encounter (Signed)
Rx was sent to pharmacy electronically. 

## 2013-03-09 ENCOUNTER — Ambulatory Visit (INDEPENDENT_AMBULATORY_CARE_PROVIDER_SITE_OTHER)
Admission: RE | Admit: 2013-03-09 | Discharge: 2013-03-09 | Disposition: A | Discharge status: Home / Self Care | Payer: Medicare Other | Source: Ambulatory Visit | Attending: Internal Medicine | Admitting: Internal Medicine

## 2013-03-09 DIAGNOSIS — R918 Other nonspecific abnormal finding of lung field: Secondary | ICD-10-CM

## 2013-03-10 ENCOUNTER — Encounter: Payer: Self-pay | Admitting: Internal Medicine

## 2013-03-13 ENCOUNTER — Telehealth: Payer: Self-pay | Admitting: Internal Medicine

## 2013-03-13 NOTE — Telephone Encounter (Signed)
Notes Recorded by Nyoka Cowden, MD on 03/10/2013 at 7:43 AM Call patient : Study is unremarkable : no change in the nodules p one years so no further f/u rec Copy of this report/ comment to primary also   I spoke with patient about results and he verbalized understanding and had no questions

## 2013-11-14 ENCOUNTER — Other Ambulatory Visit (HOSPITAL_COMMUNITY): Payer: Self-pay | Admitting: Cardiovascular Disease

## 2013-11-23 ENCOUNTER — Encounter: Payer: Self-pay | Admitting: Cardiovascular Disease

## 2013-11-23 ENCOUNTER — Ambulatory Visit (INDEPENDENT_AMBULATORY_CARE_PROVIDER_SITE_OTHER): Payer: Medicare Other | Admitting: Cardiovascular Disease

## 2013-11-23 VITALS — BP 110/72 | HR 49 | Ht 65.0 in | Wt 250.9 lb

## 2013-11-23 DIAGNOSIS — E785 Hyperlipidemia, unspecified: Secondary | ICD-10-CM

## 2013-11-23 DIAGNOSIS — I10 Essential (primary) hypertension: Secondary | ICD-10-CM

## 2013-11-23 DIAGNOSIS — Z951 Presence of aortocoronary bypass graft: Secondary | ICD-10-CM

## 2013-11-23 NOTE — Assessment & Plan Note (Signed)
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 He did have 3 vessel disease at that time. He subsequently underwent cardiac catheterization 05/11/12 by Dr. Bryan Lemma and ultimately underwent coronary artery bypass grafting by Dr. Cornelius Moras with the LIMA to his LAD, left radial to obtuse marginal branch and a vein to the PDA. His postop course was uncomplicated. He denies chest pain or shortness of breath.

## 2013-11-23 NOTE — Assessment & Plan Note (Signed)
Controlled on current medications 

## 2013-11-23 NOTE — Progress Notes (Signed)
11/23/2013 Trevor Jackson   Nov 13, 1960  [id number]  Primary Physician MABE,DAVID, NP Primary Cardiologist: Trevor Gess MD [name]   HPI:  The patient is a 53 year old moderately overweight married [place] American male father of 3, grandfather to 4 grandchildren who I last saw 8 months ago. He has a history of CAD status post RCA stenting in the past in the setting of an inferior ST-segment elevation myocardial infarction. He had an occluded PDA which I opened with a balloon and high-grade proximal disease which I stented with a Resolute drug-eluting stent. His EF at that time was 50-55% with moderate inferobasal hypokinesia. His other problems include hypertension, hyperlipidemia and non-insulin-requiring diabetes as well as family history. He had a Myoview performed in May which showed inferior scar without ischemia. He was complaining of increasing nitroglycerin responsive chest pain with some dyspnea on exertion as well as claudication. Lower extremity Dopplers performed in our office were normal. He was seen by Nada Boozer May 11, 2012 with accelerated angina and was cathed by Dr. Herbie Jackson revealing 3-vessel disease. He ultimately coronary artery bypass grafting x3 on June 08, 2012 by Dr. Ashley Jackson. He had a LIMA to his LAD, a left radial to an obtuse marginal branch and a vein to the PDA. Postoperative course was uncomplicated. I last saw him 07/01/12 he has remained clinically stable. He did undergo the cardiac rehabilitation program which he found helpful. He denies chest pain or shortness of breath. Dr. Renae Gloss called his lipid profile closely.     Current Outpatient Prescriptions  Medication Sig Dispense Refill  . albuterol (VENTOLIN HFA) 108 (90 BASE) MCG/ACT inhaler Inhale 2 puffs into the lungs every 6 (six) hours as needed. For shortness of breath      . aspirin EC 325 MG EC tablet Take 1 tablet (325 mg total) by mouth daily.  30 tablet    . buPROPion  (WELLBUTRIN) 100 MG tablet Take 100 mg by mouth 2 (two) times daily.      . Cholecalciferol (VITAMIN D PO) Take 1 tablet by mouth daily.      . Exenatide (BYDUREON) 2 MG SUSR Inject 2 mg into the skin once a week.      . furosemide (LASIX) 40 MG tablet take 1 tablet by mouth once daily  30 tablet  0  . insulin detemir (LEVEMIR) 100 UNIT/ML injection Inject 40 Units into the skin at bedtime.      . Linagliptin-Metformin HCl 2.5-500 MG TABS Take 1 tablet by mouth 2 (two) times daily.       Marland Kitchen losartan (COZAAR) 100 MG tablet Take 1 tablet (100 mg total) by mouth daily.  30 tablet  1  . metoprolol tartrate (LOPRESSOR) 25 MG tablet Take 1 tablet (25 mg total) by mouth 2 (two) times daily.  60 tablet  1  . Multiple Vitamins-Minerals (MULTIVITAMINS THER. W/MINERALS) TABS Take 1 tablet by mouth daily.      . niacin (NIASPAN) 500 MG CR tablet Take 500 mg by mouth 2 (two) times daily.       . potassium chloride SA (K-DUR,KLOR-CON) 20 MEQ tablet Take 1 tablet (20 mEq total) by mouth 2 (two) times daily.  60 tablet  5  . rosuvastatin (CRESTOR) 40 MG tablet Take 20 mg by mouth 2 (two) times daily. Takes two 20mg  tablets daily      . sertraline (ZOLOFT) 100 MG tablet Take 100 mg by mouth daily.      Marland Kitchen venlafaxine XR (  EFFEXOR XR) 37.5 MG 24 hr capsule Take 37.5 mg by mouth daily.      . vitamin C (ASCORBIC ACID) 500 MG tablet Take 500 mg by mouth daily.      Marland Kitchen. zolpidem (AMBIEN) 10 MG tablet Take 10 mg by mouth at bedtime as needed. For sleep       No current facility-administered medications for this visit.    No Known Allergies  History   Social History  . Marital Status: Married    Spouse Name: N/A    Number of Children: 2  . Years of Education: N/A   Occupational History  . disabled after MI    Social History Main Topics  . Smoking status: Never Smoker   . Smokeless tobacco: Never Used  . Alcohol Use: No  . Drug Use: No  . Sexual Activity: Not on file   Other Topics Concern  . Not on file    Social History Narrative   On disability since heart attack in 2012, previously employed as Retail bankeraircraft mechanic     Review of Systems: General: negative for chills, fever, night sweats or weight changes.  Cardiovascular: negative for chest pain, dyspnea on exertion, edema, orthopnea, palpitations, paroxysmal nocturnal dyspnea or shortness of breath Dermatological: negative for rash Respiratory: negative for cough or wheezing Urologic: negative for hematuria Abdominal: negative for nausea, vomiting, diarrhea, bright red blood per rectum, melena, or hematemesis Neurologic: negative for visual changes, syncope, or dizziness All other systems reviewed and are otherwise negative except as noted above.    Blood pressure 110/72, pulse 49, height 5\' 5"  (1.651 m), weight 250 lb 14.4 oz (113.807 kg).  General appearance: alert and no distress Neck: no adenopathy, no carotid bruit, no JVD, supple, symmetrical, trachea midline and thyroid not enlarged, symmetric, no tenderness/mass/nodules Lungs: clear to auscultation bilaterally Heart: regular rate and rhythm, S1, S2 normal, no murmur, click, rub or gallop Extremities: extremities normal, atraumatic, no cyanosis or edema  EKG sinus bradycardia at 49 without ST or T wave changes  ASSESSMENT AND PLAN:   S/P CABG x 3 History of CAD status post inferior wall myocardial infarction treated with stenting by myself 01/07/11. He did have 3 vessel disease at that time. He subsequently underwent cardiac catheterization 05/11/12 by Dr. Bryan Lemmaavid Jackson and ultimately underwent coronary artery bypass grafting by Dr. Cornelius Moraswen with the LIMA to his LAD, left radial to obtuse marginal branch and a vein to the PDA. His postop course was uncomplicated. He denies chest pain or shortness of breath.  Hyperlipidemia On statin therapy followed by his PCP  Hypertension Controlled on current medications      Trevor GessJonathan J. Berry MD [facility] AnFACP,FACC,FAHA, [facility] 11/23/2013 12:24  PM

## 2013-11-23 NOTE — Patient Instructions (Signed)
Your physician recommends that you schedule a follow-up appointment in: 1 Year  Your physician has recommended you make the following change in your medication: Decrease Metoprolol to 12.5 mg twice daily

## 2013-11-23 NOTE — Assessment & Plan Note (Signed)
On statin therapy followed by his PCP 

## 2013-12-11 ENCOUNTER — Other Ambulatory Visit: Payer: Self-pay | Admitting: Cardiovascular Disease

## 2013-12-11 NOTE — Telephone Encounter (Signed)
Rx was sent to pharmacy electronically. 

## 2013-12-25 ENCOUNTER — Other Ambulatory Visit (HOSPITAL_COMMUNITY): Payer: Self-pay | Admitting: Cardiovascular Disease

## 2013-12-25 NOTE — Telephone Encounter (Signed)
Rx refill sent to patient pharmacy   

## 2013-12-30 IMAGING — CR DG CHEST 2V
2 series · 2 of 2 positions shown · non-contrast
Comparison: 05/11/2012

CLINICAL DATA: Preoperative respiratory exam.  Coronary artery
disease.

CHEST - 2 VIEW

[view not recorded (1 of 2)]
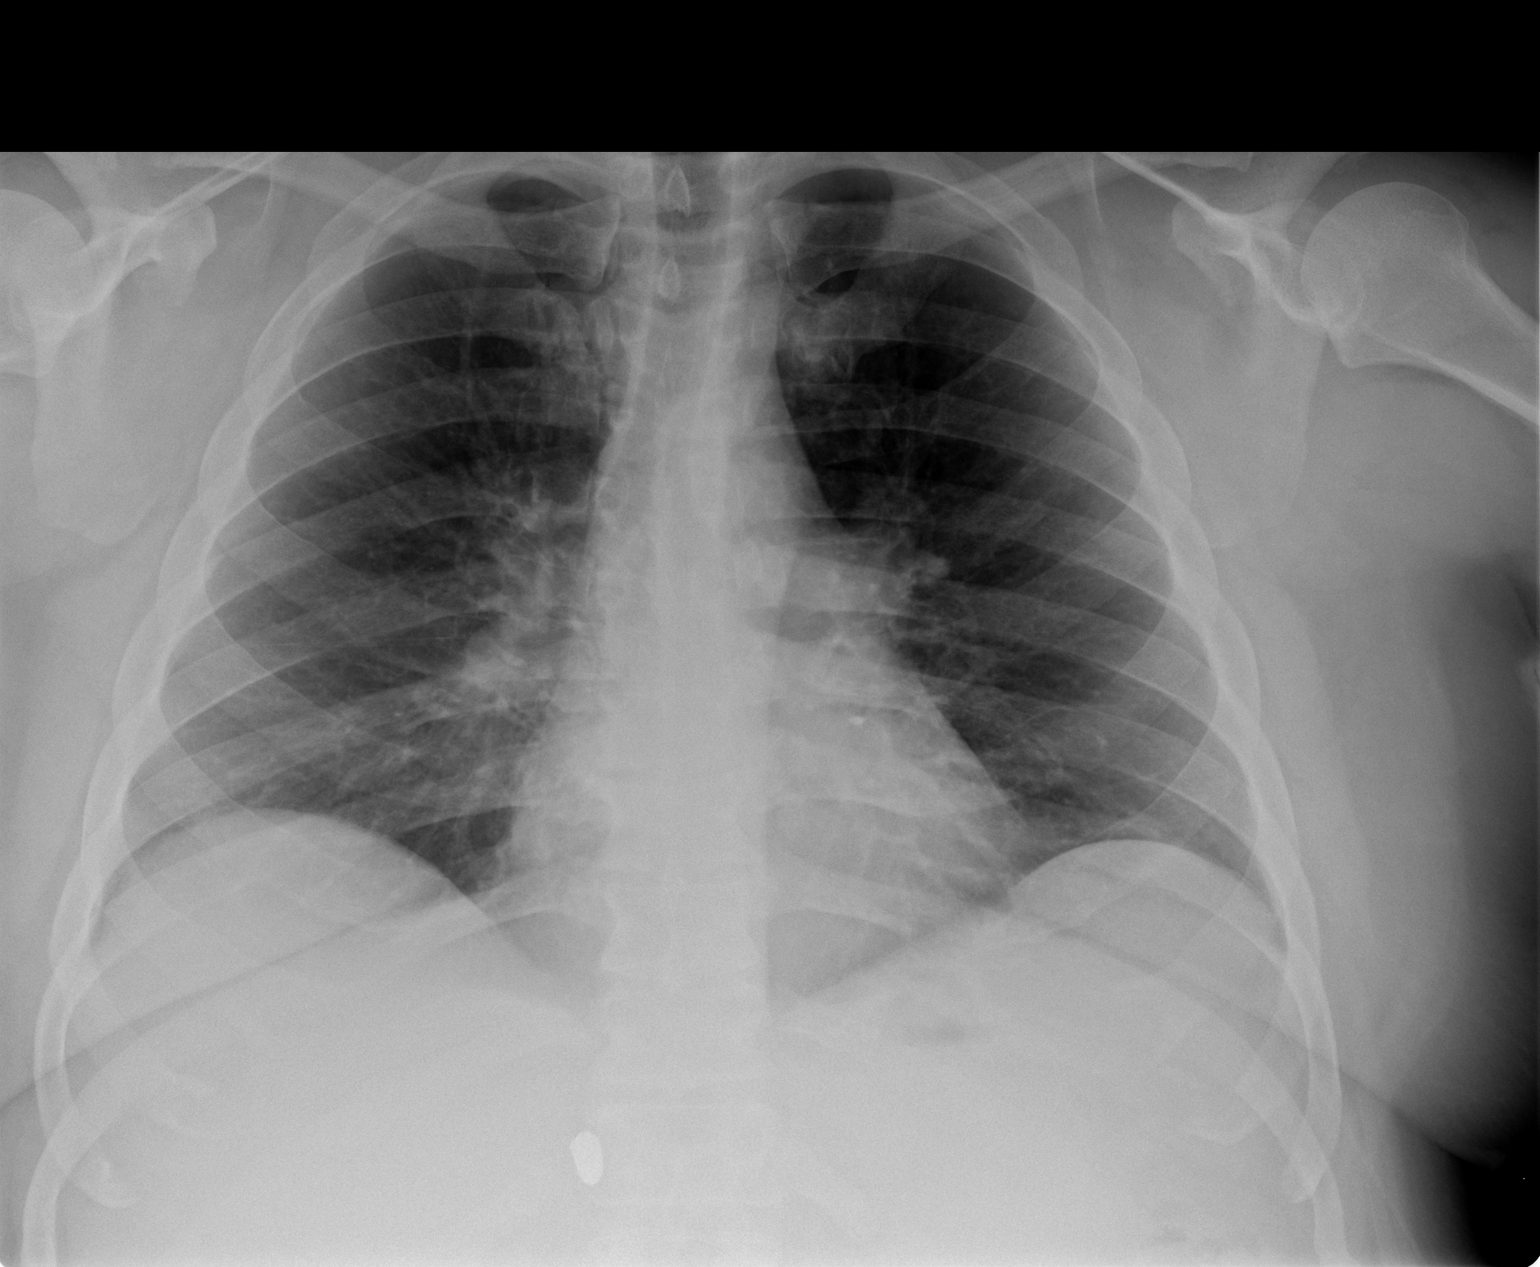

[view not recorded (2 of 2)]
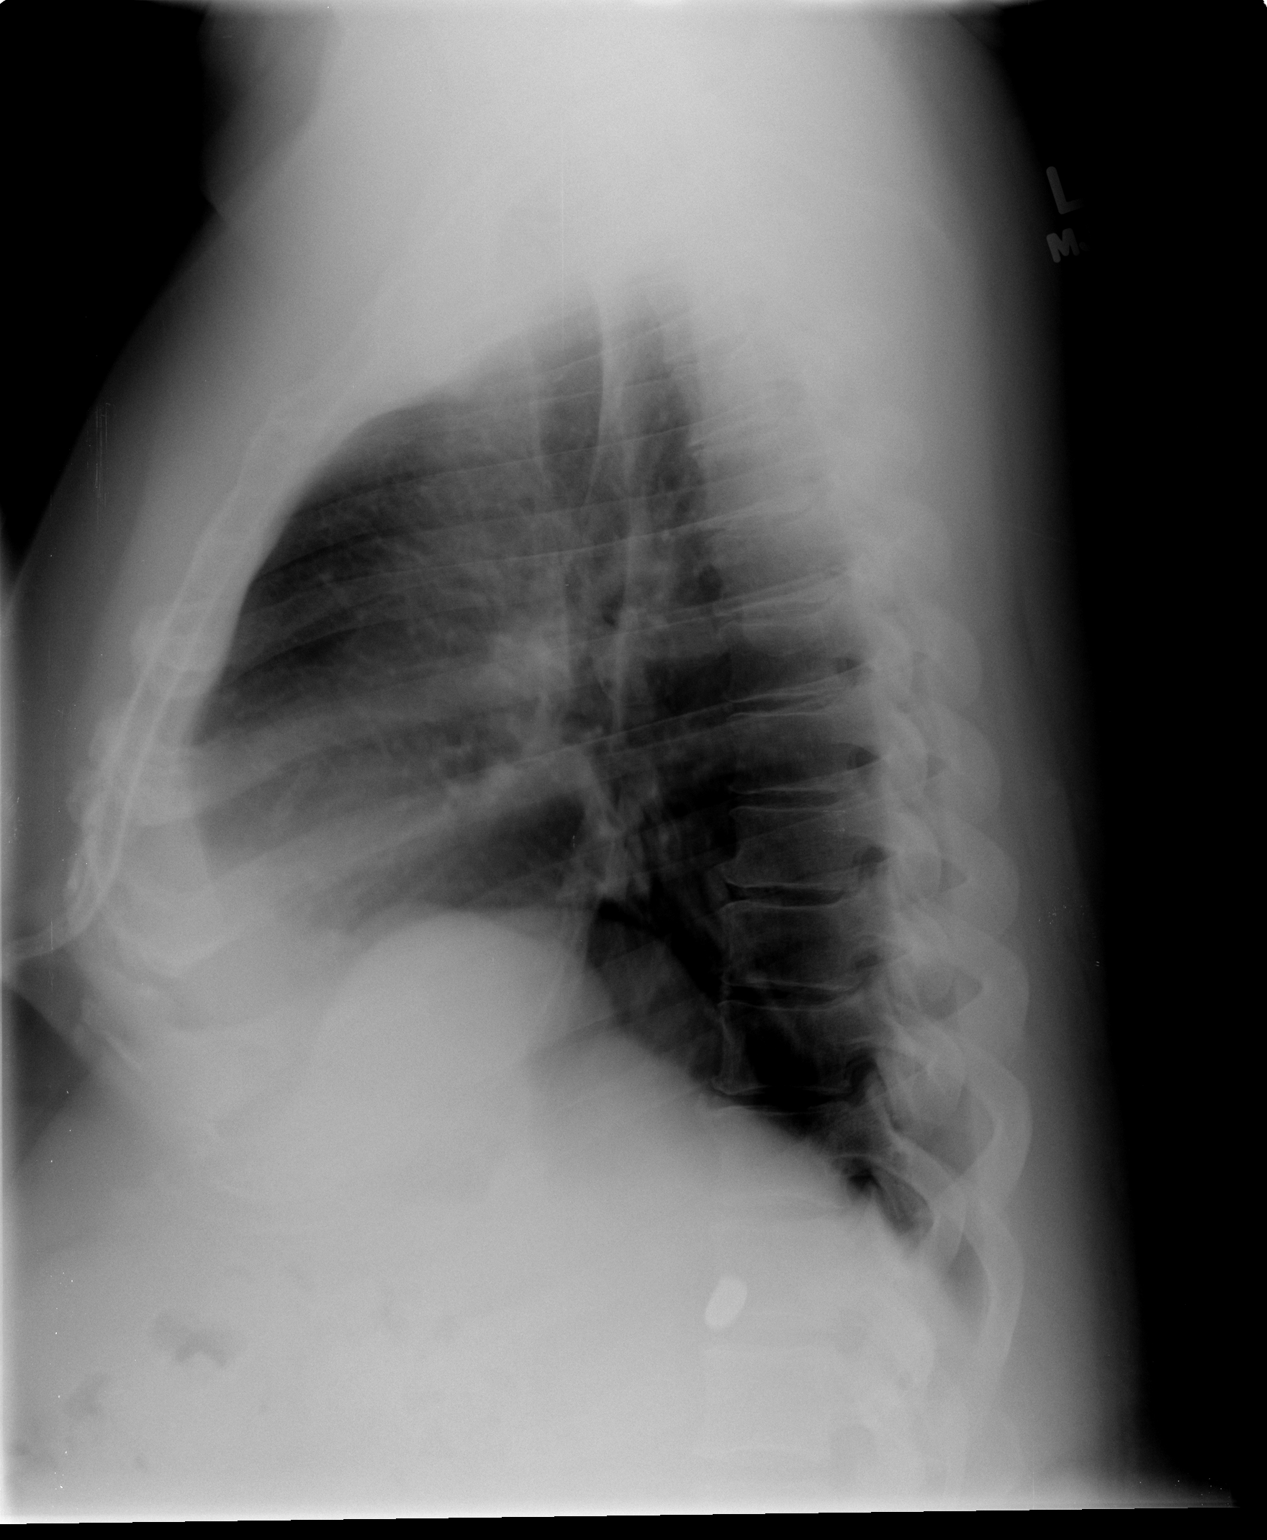

[2 of 2 positions shown; findings below may reference images not displayed]

FINDINGS: The heart size and pulmonary vascularity are normal and
the lungs are clear.  No osseous abnormality.
IMPRESSION: No acute disease.

## 2014-01-02 IMAGING — CR DG CHEST 1V PORT
1 series · 1 of 1 positions shown · non-contrast
Comparison: 06/08/2012

CLINICAL DATA: Status post CABG.

PORTABLE CHEST - 1 VIEW

[AP]
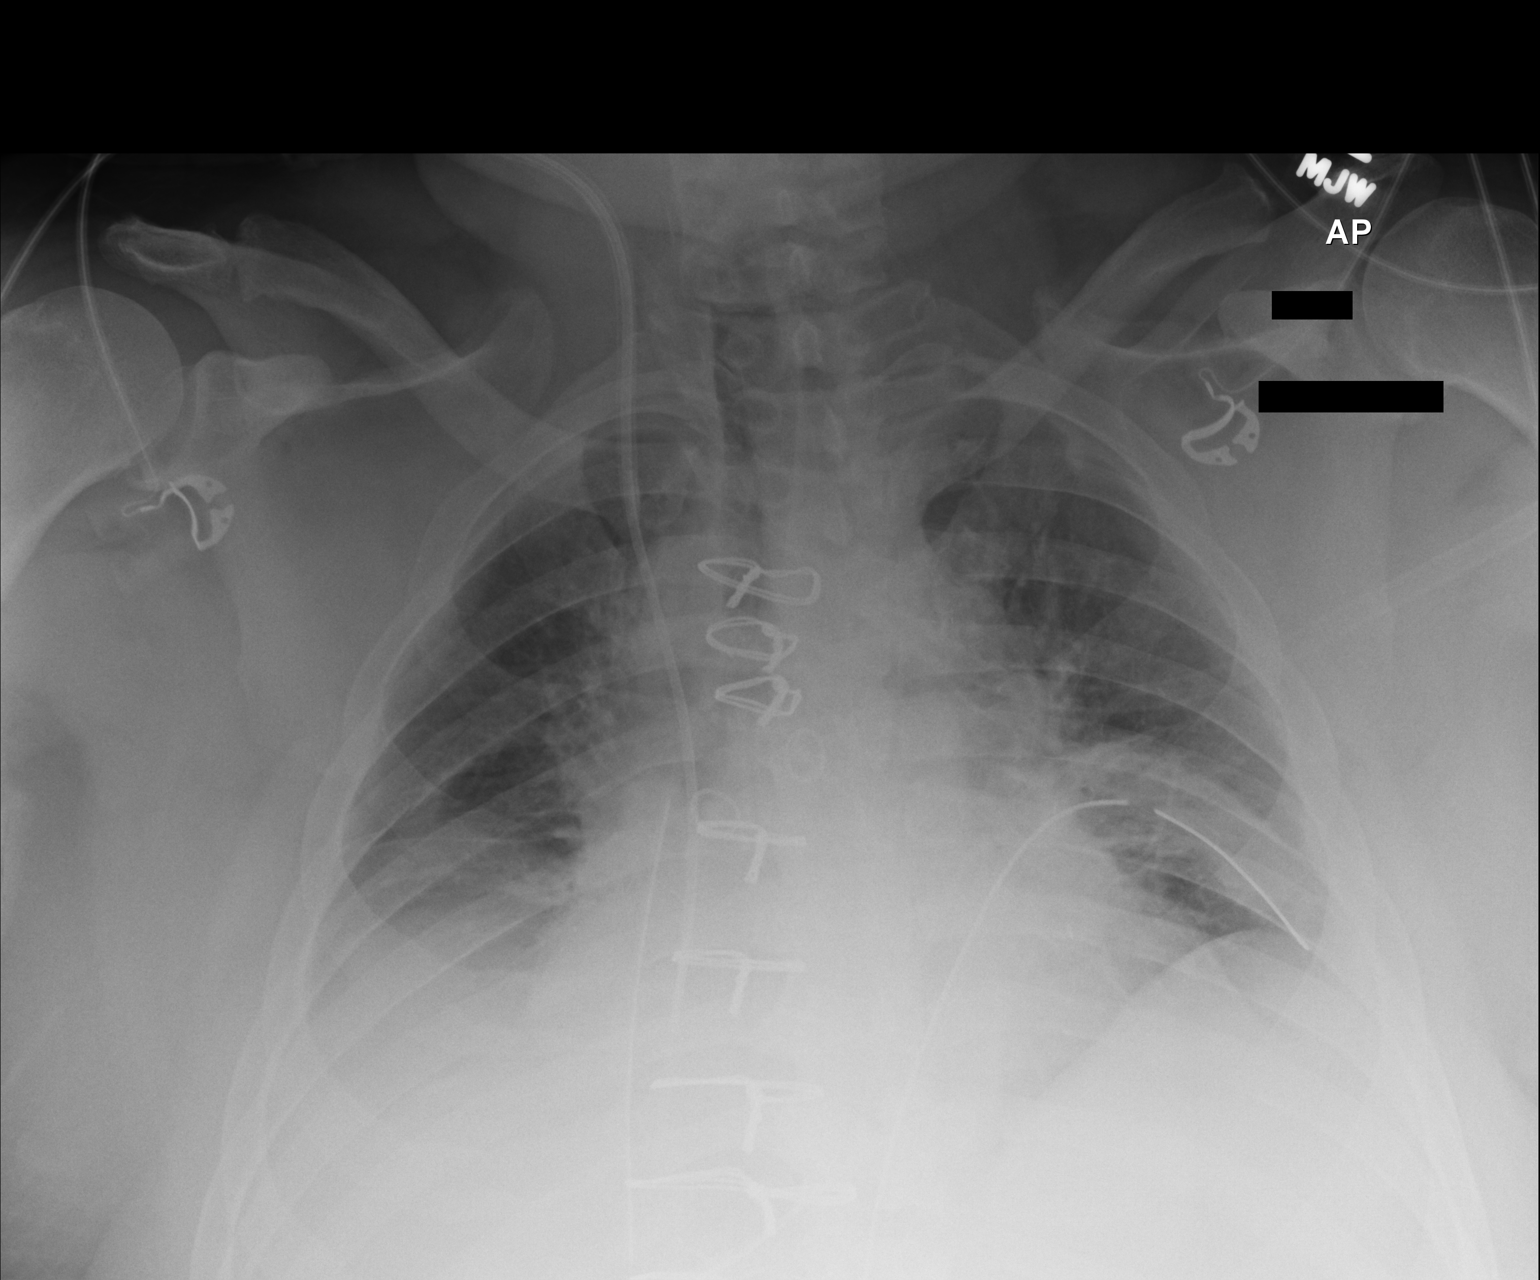

[1 of 1 positions shown; findings below may reference images not displayed]

FINDINGS: Left chest tube remains in place without pneumothorax.
Swan-Ganz catheter has been replaced with a central line extending
just into the right atrium.  Lungs show low volumes and bilateral
lower lobe atelectasis after extubation.  No significant pleural
fluid identified.  Heart size and mediastinal contours are stable.
IMPRESSION: No pneumothorax.  Bilateral lower lobe atelectasis.

## 2014-01-04 IMAGING — CR DG CHEST 2V
2 series · 2 of 2 positions shown · non-contrast
Comparison: 06/10/2012

CLINICAL DATA: Atelectasis.

CHEST - 2 VIEW

[w chest pa *]
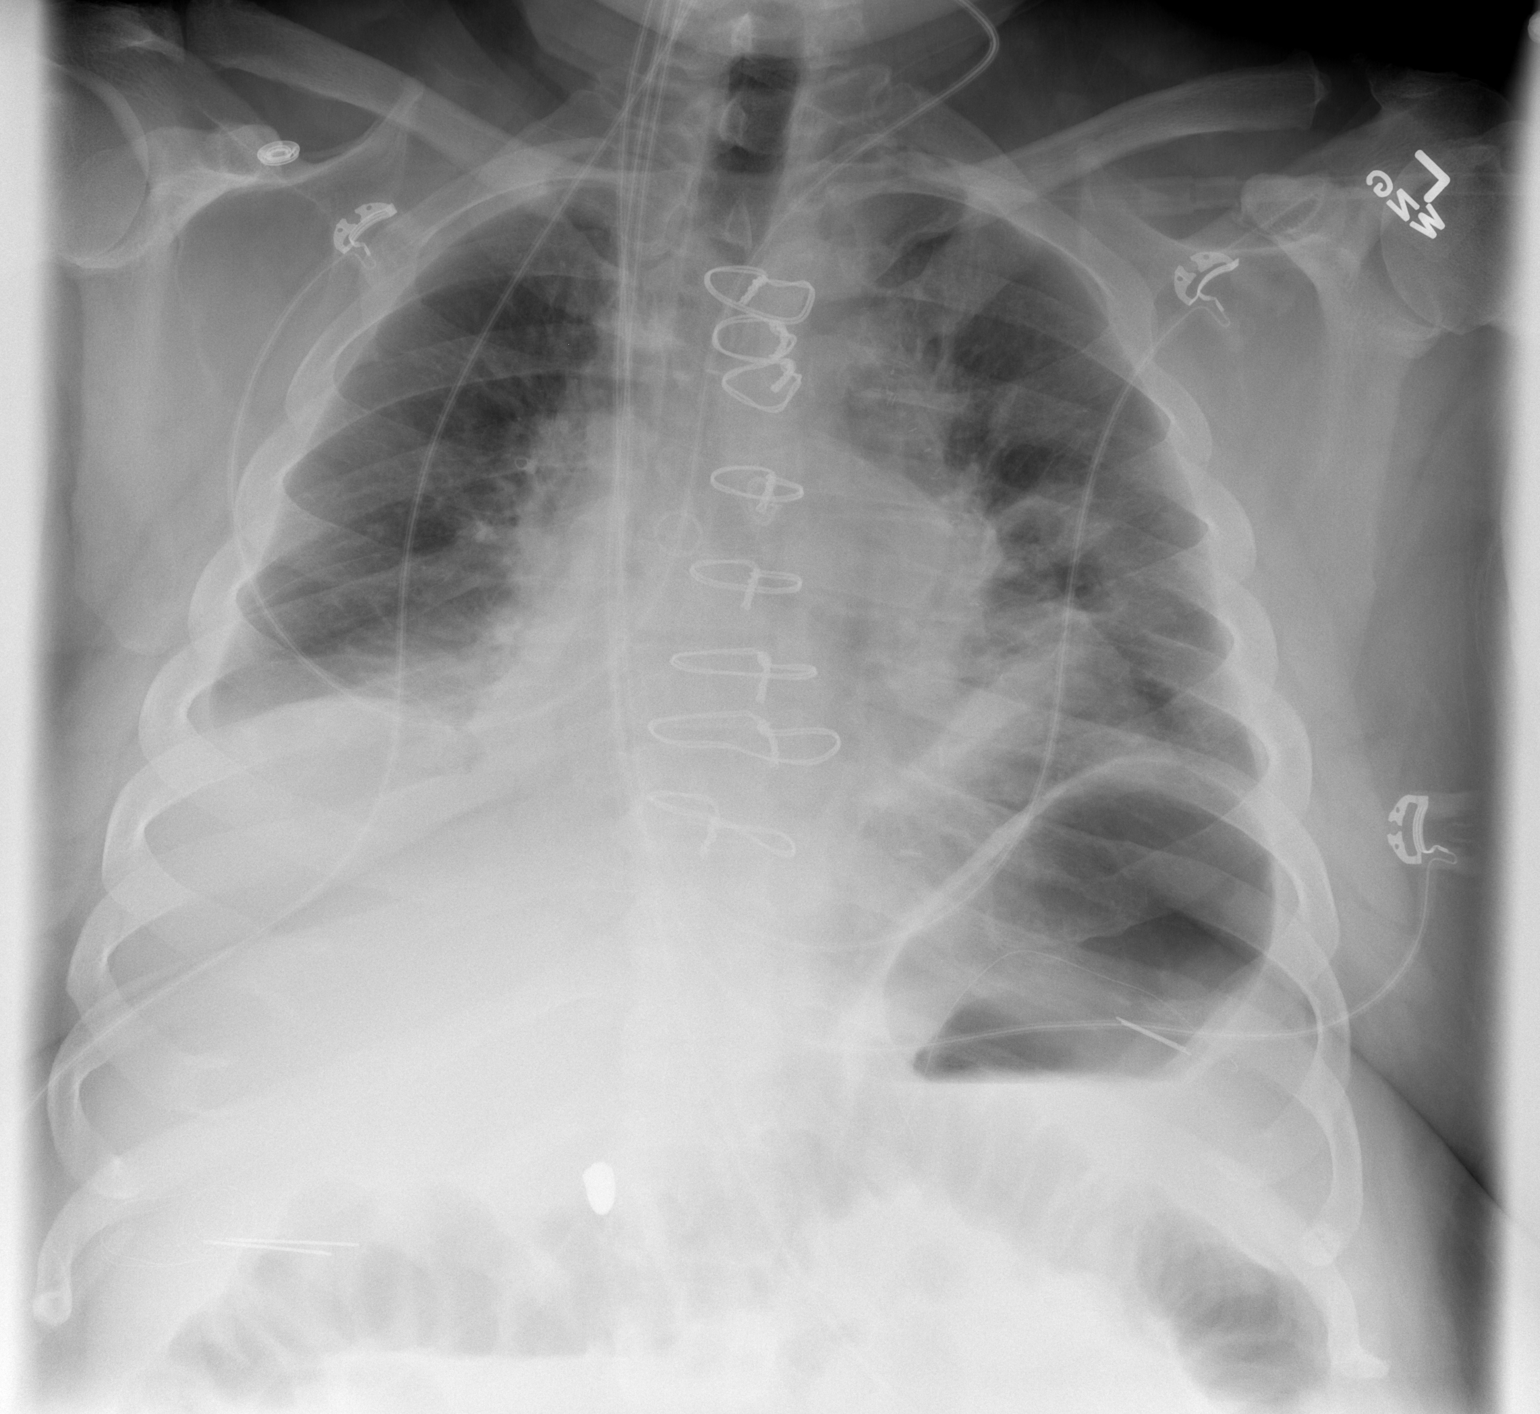

[w chest lat *]
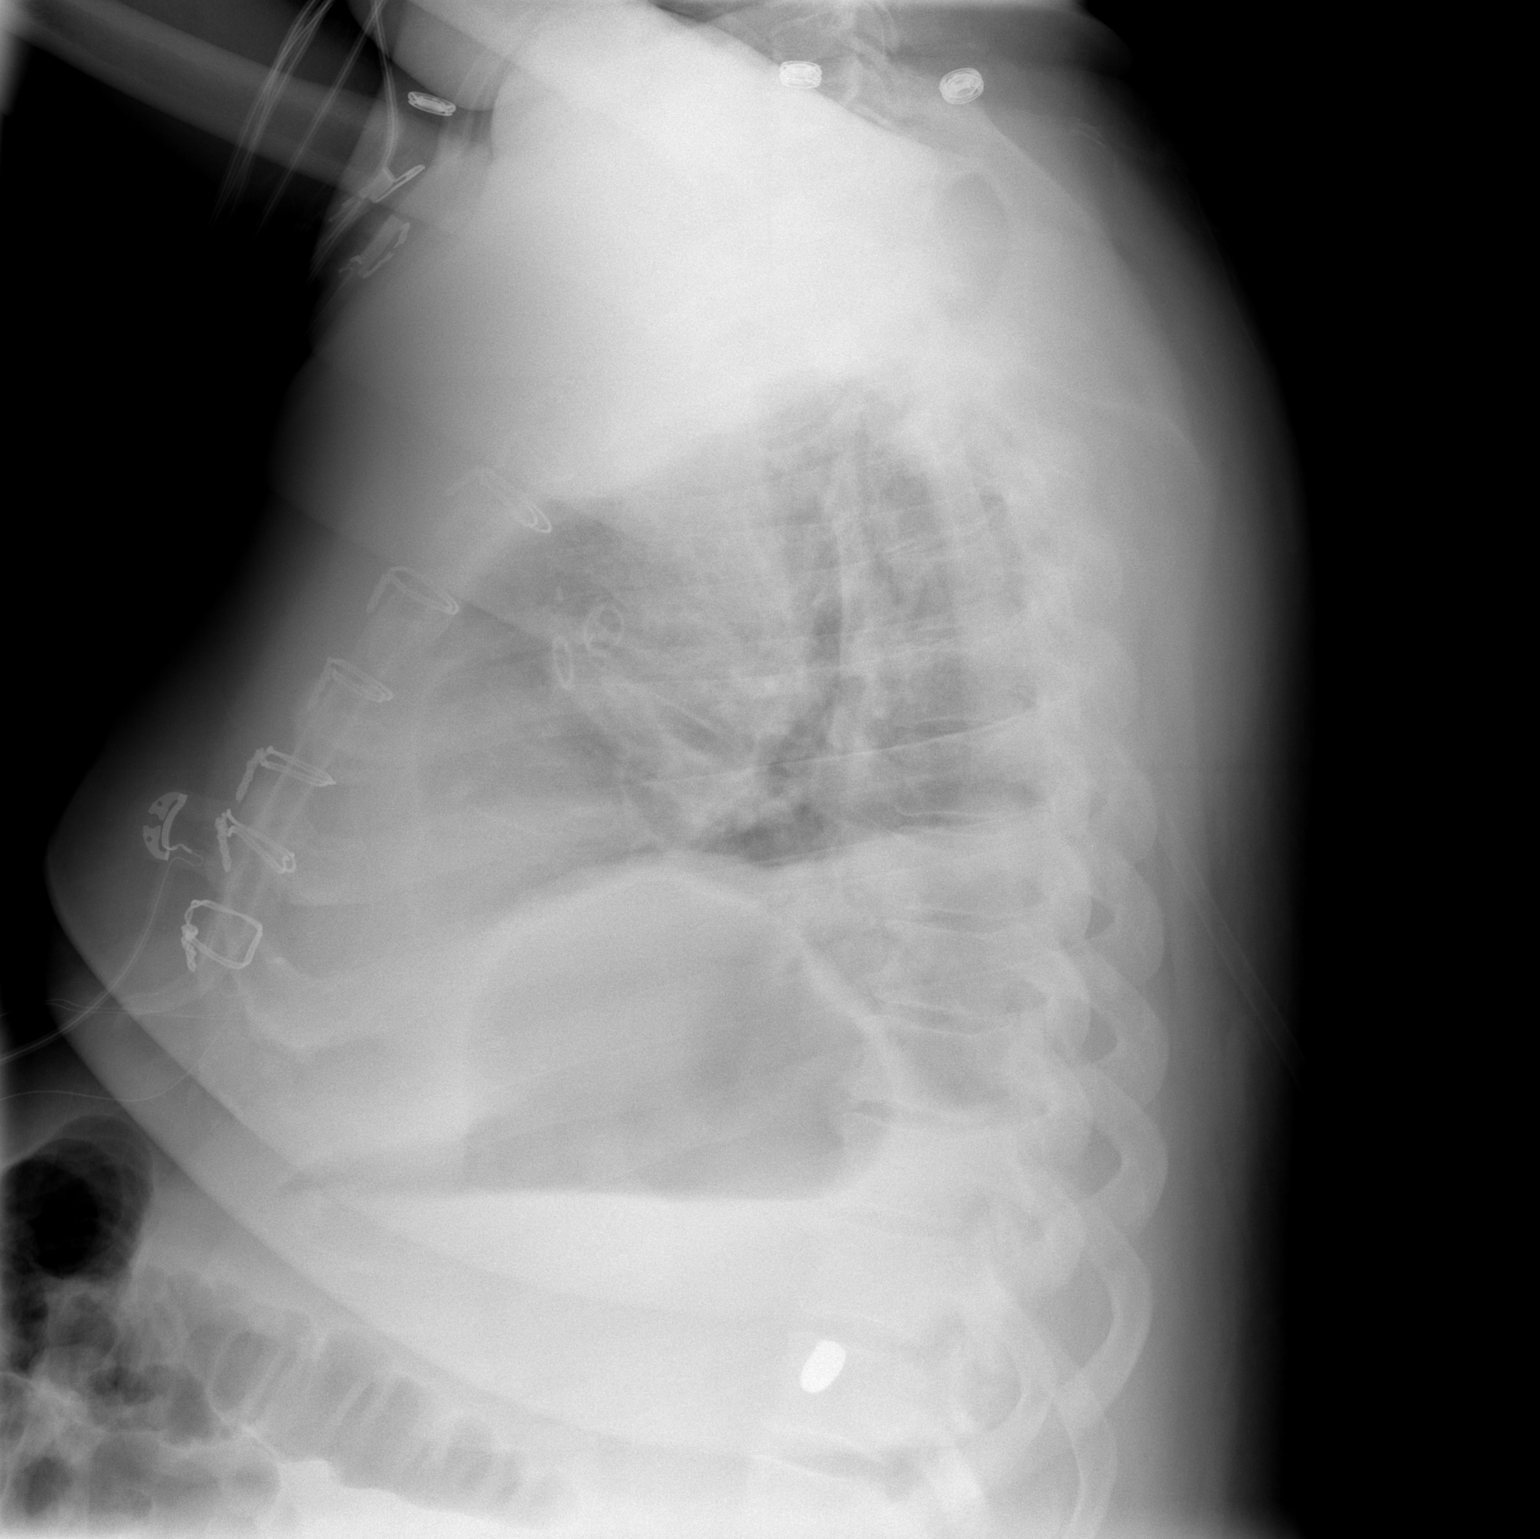

[2 of 2 positions shown; findings below may reference images not displayed]

FINDINGS: Chest tubes have been removed.  No pneumothorax.
Persistent atelectasis at the right base.  New slight linear
atelectasis at the left base.  Heart size and vascularity are
normal.
IMPRESSION: 1.  No pneumothorax with chest tube removal.
2.  Bibasilar atelectasis, new on the left.

## 2014-01-27 ENCOUNTER — Ambulatory Visit (INDEPENDENT_AMBULATORY_CARE_PROVIDER_SITE_OTHER): Payer: No Typology Code available for payment source | Admitting: Internal Medicine

## 2014-01-27 VITALS — BP 124/84 | HR 67 | Temp 98.6°F | Resp 16 | Ht 65.25 in | Wt 245.0 lb

## 2014-01-27 DIAGNOSIS — M542 Cervicalgia: Secondary | ICD-10-CM

## 2014-01-27 MED ORDER — MELOXICAM 15 MG PO TABS: 15.0000 mg | ORAL_TABLET | Freq: Every day | ORAL | Status: DC

## 2014-01-27 MED ORDER — CYCLOBENZAPRINE HCL 10 MG PO TABS: 10.0000 mg | ORAL_TABLET | Freq: Every day | ORAL | Status: DC

## 2014-01-27 NOTE — Progress Notes (Signed)
Subjective:    Patient ID: Trevor Jackson, male    DOB: 03/30/1961, 53 y.o.   MRN: [id number]  HPI  Chief Complaint  Patient presents with  . Neck Pain    x 4 days   awoke with neck pain and stiffness on the left side 4 days ago Has not improved since Able to drive No radicular symptoms Pain with motion in the left side of the neck extending up to the back of the head No headache No prior neck problem  Recent disability secondary to heart  Patient Active Problem List   Diagnosis Date Noted  . 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 Coronary atherosclerosis of native coronary artery 06/06/2012  . Coronary artery disease 05/18/2012  . CAD (coronary artery disease) 05/18/2012  . Morbid obesity with BMI of 40.0-44.9, adult 05/18/2012  . Hypertension 05/18/2012  . Hyperlipidemia 05/18/2012  . Headache 05/18/2012  . Anxiety and depression 05/18/2012  . Obstructive sleep apnea 05/18/2012  . Multiple pulmonary nodules 02/25/2012  . Cough 02/18/2012  . Dyspnea 02/18/2012   Prior to Admission medications   Medication Sig Start Date End Date Taking? Authorizing Provider  albuterol (VENTOLIN HFA) 108 (90 BASE) MCG/ACT inhaler Inhale 2 puffs into the lungs every 6 (six) hours as needed. For shortness of breath   Yes Historical Provider, MD  aspirin EC 325 MG EC tablet Take 1 tablet (325 mg total) by mouth daily. 06/13/12  Yes Wayne E Gold, PA-C  Dapagliflozin Propanediol (FARXIGA) 10 MG TABS Take 10 mg by mouth daily.   Yes Historical Provider, MD  furosemide (LASIX) 40 MG tablet take 1 tablet by mouth once daily 12/25/13  Yes Mihai Croitoru, MD  insulin detemir (LEVEMIR) 100 UNIT/ML injection Inject 40 Units into the skin at bedtime.   Yes Historical Provider, MD  Linagliptin-Metformin HCl 2.5-500 MG TABS Take 1 tablet by mouth 2 (two) times daily.    Yes Historical Provider, MD  losartan (COZAAR) 100 MG tablet Take 1 tablet (100 mg total) by mouth daily. 06/14/12  Yes Wilmon PaliGina L Collins, PA-C    metoprolol tartrate (LOPRESSOR) 25 MG tablet Take 12.5 mg by mouth 2 (two) times daily. 06/13/12  Yes Wayne E Gold, PA-C  niacin (NIASPAN) 500 MG CR tablet Take 500 mg by mouth 2 (two) times daily.    Yes Historical Provider, MD  potassium chloride SA (K-DUR,KLOR-CON) 20 MEQ tablet take 1 tablet by mouth twice a day 12/11/13  Yes Runell GessJonathan J Berry, MD  buPROPion [facility](WELLBUTRIN) 100 MG tablet Take 100 mg by mouth 2 (two) times daily.    Historical Provider, MD  Cholecalciferol (VITAMIN D PO) Take 1 tablet by mouth daily.    Historical Provider, MD  cyclobenzaprine (FLEXERIL) 10 MG tablet Take 1 tablet (10 mg total) by mouth at bedtime. 01/27/14   Tonye Pearsonobert P Abdulhadi Stopa, MD  Exenatide (BYDUREON) 2 MG SUSR Inject 2 mg into the skin once a week.    Historical Provider, MD  meloxicam (MOBIC) 15 MG tablet Take 1 tablet (15 mg total) by mouth daily. 01/27/14   Tonye Pearsonobert P Vishaal Strollo, MD  Multiple Vitamins-Minerals (MULTIVITAMINS THER. W/MINERALS) TABS Take 1 tablet by mouth daily.    Historical Provider, MD  Niacin CR 1000 MG TBCR  10/28/13   Historical Provider, MD  rosuvastatin (CRESTOR) 40 MG tablet Take 20 mg by mouth 2 (two) times daily. Takes two 20mg  tablets daily 06/14/12   Wilmon PaliGina L Collins, PA-C  sertraline (ZOLOFT) 100 MG tablet Take 100 mg by  mouth daily.    Historical Provider, MD  venlafaxine XR (EFFEXOR XR) 37.5 MG 24 hr capsule Take 37.5 mg by mouth daily.    Historical Provider, MD  vitamin C (ASCORBIC ACID) 500 MG tablet Take 500 mg by mouth daily.    Historical Provider, MD  zolpidem (AMBIEN) 10 MG tablet Take 10 mg by mouth at bedtime as needed. For sleep    Historical Provider, MD        Review of Systems Noncontributory    Objective:   Physical Exam BP 124/84  Pulse 67  Temp(Src) 98.6 F (37 C) (Oral)  Resp 16  Ht 5' 5.25" (1.657 m)  Wt 245 lb (111.131 kg)  BMI 40.48 kg/m2  SpO2 97%  no acute distress Tender along the left posterior cervical muscle group sparing the left  trapezius Tender over the left side of the occiput without swelling Mild increased pain with forward flexion or with into the left No peripheral motor or sensory losses Deep tendon reflexes preserved       Assessment & Plan:  Neck pain-torticollis ?Neck arthritis--if not resolved after treatment will proceed with studies  Meds ordered this encounter  Medications  . Niacin CR 1000 MG TBCR    Sig:   . cyclobenzaprine (FLEXERIL) 10 MG tablet    Sig: Take 1 tablet (10 mg total) by mouth at bedtime.    Dispense:  30 tablet    Refill:  0  . meloxicam (MOBIC) 15 MG tablet    Sig: Take 1 tablet (15 mg total) by mouth daily.    Dispense:  30 tablet    Refill:  0   Neck book for exercises Followup 2 weeks if not well

## 2014-06-07 ENCOUNTER — Encounter (HOSPITAL_COMMUNITY): Payer: Self-pay | Admitting: Cardiology

## 2014-08-01 ENCOUNTER — Other Ambulatory Visit (HOSPITAL_COMMUNITY): Payer: Self-pay | Admitting: Cardiovascular Disease

## 2014-08-01 NOTE — Telephone Encounter (Signed)
Rx has been sent to the pharmacy electronically. ° °

## 2014-08-10 ENCOUNTER — Encounter (HOSPITAL_COMMUNITY): Payer: Self-pay | Admitting: *Deleted

## 2014-08-10 ENCOUNTER — Emergency Department (INDEPENDENT_AMBULATORY_CARE_PROVIDER_SITE_OTHER): Payer: Medicare HMO

## 2014-08-10 ENCOUNTER — Emergency Department (HOSPITAL_COMMUNITY)
Admission: EM | Admit: 2014-08-10 | Discharge: 2014-08-10 | Disposition: A | Discharge status: Home / Self Care | Payer: Medicare HMO | Source: Home / Self Care | Attending: Family Medicine | Admitting: Family Medicine

## 2014-08-10 DIAGNOSIS — S62639A Displaced fracture of distal phalanx of unspecified finger, initial encounter for closed fracture: Secondary | ICD-10-CM

## 2014-08-10 MED ORDER — TETANUS-DIPHTH-ACELL PERTUSSIS 5-2.5-18.5 LF-MCG/0.5 IM SUSP
INTRAMUSCULAR | Status: AC
  Filled 2014-08-10: qty 0.5

## 2014-08-10 MED ORDER — TETANUS-DIPHTH-ACELL PERTUSSIS 5-2.5-18.5 LF-MCG/0.5 IM SUSP
0.5000 mL | Freq: Once | INTRAMUSCULAR | Status: AC
  Administered 2014-08-10: 0.5 mL via INTRAMUSCULAR

## 2014-08-10 MED ORDER — HYDROCODONE-ACETAMINOPHEN 5-325 MG PO TABS: 1.0000 | ORAL_TABLET | Freq: Four times a day (QID) | ORAL | Status: AC | PRN

## 2014-08-10 NOTE — ED Provider Notes (Signed)
CSN: [id number]     Arrival date & time 08/10/14  1427 History   First MD Initiated Contact with Patient 08/10/14 1454     Chief Complaint  Patient presents with  . Finger Injury   (Consider location/radiation/quality/duration/timing/severity/associated sxs/prior Treatment) Patient is a 54 y.o. male presenting with hand pain. The history is provided by the patient.  Hand Pain This is a new problem. The current episode started 1 to 2 hours ago (struck with hammer , now with distal phalanx sts.). The problem has not changed since onset.   Past Medical History  Diagnosis Date  . Diabetes mellitus   . Benign head tremor   . OSA (obstructive sleep apnea)   . Anxiety   . Coronary artery disease 05/18/2012  . Morbid obesity with BMI of 40.0-44.9, adult 05/18/2012  . Hyperlipidemia   . Headache(784.0) 05/18/2012  . Anxiety and depression 05/18/2012  . Obstructive sleep apnea 05/18/2012    On CPAP  . Myocardial infarction 01/07/2011  . Hypertension     dr berry  . 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    LIMA to LAD, LRA to OM, SVG to PDA, EVH from right thigh   Past Surgical History  Procedure Laterality Date  . Shoulder surgery  2008  . Coronary angioplasty with stent placement  01-07-11  . Kidney stone removal    . Coronary artery bypass graft  06/08/2012    Procedure: CORONARY ARTERY BYPASS GRAFTING (CABG);  Surgeon: Purcell Nails, MD;  Location: [facility] OR;  Service: Open Heart Surgery;  Laterality: N/A;  . Radial artery harvest  06/08/2012    Procedure: RADIAL ARTERY HARVEST;  Surgeon: Purcell Nails, MD;  Location: [facility] OR;  Service: Open Heart Surgery;  Laterality: Left;  . Left heart catheterization with coronary angiogram N/A 05/13/2012    Procedure: LEFT HEART CATHETERIZATION WITH CORONARY ANGIOGRAM;  Surgeon: Marykay Lex, MD;  Location: [facility] CATH LAB;  Service: Cardiovascular;  Laterality: N/A;   Family History  Problem Relation Age of Onset  . Heart disease Mother   . Heart disease  Sister   . Bone cancer Father   . Prostate cancer Brother   . Hyperlipidemia Mother   . Hypertension Mother   . Diabetes Sister   . Hyperlipidemia Sister   . Hypertension Sister   . Hyperlipidemia Brother   . Hypertension Brother    History  Substance Use Topics  . Smoking status: Never Smoker   . Smokeless tobacco: Never Used  . Alcohol Use: No    Review of Systems  Constitutional: Negative.   Musculoskeletal: Positive for joint swelling.  Skin: Positive for wound.    Allergies  Review of patient's allergies indicates no known allergies.  Home Medications   Prior to Admission medications   Medication Sig Start Date End Date Taking? Authorizing Provider  albuterol (VENTOLIN HFA) 108 (90 BASE) MCG/ACT inhaler Inhale 2 puffs into the lungs every 6 (six) hours as needed. For shortness of breath    Historical Provider, MD  aspirin EC 325 MG EC tablet Take 1 tablet (325 mg total) by mouth daily. 06/13/12   Wayne E Gold, PA-C  buPROPion (WELLBUTRIN) 100 MG tablet Take 100 mg by mouth 2 (two) times daily.    Historical Provider, MD  Cholecalciferol (VITAMIN D PO) Take 1 tablet by mouth daily.    Historical Provider, MD  cyclobenzaprine (FLEXERIL) 10 MG tablet Take 1 tablet (10 mg total) by mouth at bedtime. 01/27/14   Tonye Pearson,  MD  Dapagliflozin Propanediol (FARXIGA) 10 MG TABS Take 10 mg by mouth daily.    Historical Provider, MD  Exenatide (BYDUREON) 2 MG SUSR Inject 2 mg into the skin once a week.    Historical Provider, MD  furosemide (LASIX) 40 MG tablet take 1 tablet by mouth once daily 08/01/14   Thurmon FairMihai Croitoru, MD  HYDROcodone-acetaminophen (NORCO/VICODIN) 5-325 MG per tablet Take 1 tablet by mouth every 6 (six) hours as needed for moderate pain or severe pain. 08/10/14   Linna HoffJames D Laurissa Cowper, MD  insulin detemir (LEVEMIR) 100 UNIT/ML injection Inject 40 Units into the skin at bedtime.    Historical Provider, MD  Linagliptin-Metformin HCl 2.5-500 MG TABS Take 1 tablet by mouth  2 (two) times daily.     Historical Provider, MD  losartan (COZAAR) 100 MG tablet Take 1 tablet (100 mg total) by mouth daily. 06/14/12   Wilmon PaliGina L Collins, PA-C  meloxicam (MOBIC) 15 MG tablet Take 1 tablet (15 mg total) by mouth daily. 01/27/14   Tonye Pearsonobert P Doolittle, MD  metoprolol tartrate (LOPRESSOR) 25 MG tablet Take 12.5 mg by mouth 2 (two) times daily. 06/13/12   Rowe ClackWayne E Gold, PA-C  Multiple Vitamins-Minerals (MULTIVITAMINS THER. W/MINERALS) TABS Take 1 tablet by mouth daily.    Historical Provider, MD  niacin (NIASPAN) 500 MG CR tablet Take 500 mg by mouth 2 (two) times daily.     Historical Provider, MD  Niacin CR 1000 MG TBCR  10/28/13   Historical Provider, MD  potassium chloride SA (K-DUR,KLOR-CON) 20 MEQ tablet take 1 tablet by mouth twice a day 12/11/13   Runell GessJonathan J Berry, MD  rosuvastatin (CRESTOR) 40 MG tablet Take 20 mg by mouth 2 (two) times daily. Takes two 20mg  tablets daily 06/14/12   Wilmon PaliGina L Collins, PA-C  sertraline (ZOLOFT) 100 MG tablet Take 100 mg by mouth daily.    Historical Provider, MD  venlafaxine XR (EFFEXOR XR) 37.5 MG 24 hr capsule Take 37.5 mg by mouth daily.    Historical Provider, MD  vitamin C (ASCORBIC ACID) 500 MG tablet Take 500 mg by mouth daily.    Historical Provider, MD  zolpidem (AMBIEN) 10 MG tablet Take 10 mg by mouth at bedtime as needed. For sleep    Historical Provider, MD   BP 135/87 mmHg  Pulse 78  Temp(Src) 98.3 F (36.8 C) (Oral)  Resp 16  SpO2 98% Physical Exam  Constitutional: He is oriented to person, place, and time. He appears well-developed and well-nourished.  Musculoskeletal: He exhibits tenderness.       Hands: Neurological: He is alert and oriented to person, place, and time.  Skin: Skin is warm and dry. There is erythema.  Nursing note and vitals reviewed.   ED Course  Procedures (including critical care time) Labs Review Labs Reviewed - No data to display  Imaging Review Dg Finger Ring Left  08/10/2014   CLINICAL DATA:   Injury to the distal aspect of the fourth digit today with pain  EXAM: LEFT RING FINGER 2+V  COMPARISON:  None.  FINDINGS: An undisplaced distal phalangeal tuft fracture is noted. No significant soft tissue abnormality is seen. No other fractures are noted.  IMPRESSION: Undisplaced distal phalangeal tuft fracture of the fourth digit   Electronically Signed   By: Alcide CleverMark  Lukens M.D.   On: 08/10/2014 15:45   X-rays reviewed and report per radiologist.   MDM   1. Phalanx, distal fracture of finger, closed, initial encounter  Linna Hoff, MD 08/11/14 1215

## 2014-08-10 NOTE — Discharge Instructions (Signed)
Use splint for comfort and protection. Ice and pain medicine as needed. Return as needed.

## 2014-08-10 NOTE — ED Notes (Signed)
Pt  Smashed   His l  Ring  Finger   With a  Hammer   subungal  Hematoma    Present   With  Some  Bruising noted          And  Swelling  Present

## 2014-11-28 ENCOUNTER — Encounter: Payer: Self-pay | Admitting: *Deleted

## 2014-12-02 DIAGNOSIS — I252 Old myocardial infarction: Secondary | ICD-10-CM | POA: Diagnosis not present

## 2014-12-02 DIAGNOSIS — E119 Type 2 diabetes mellitus without complications: Secondary | ICD-10-CM | POA: Diagnosis not present

## 2014-12-02 DIAGNOSIS — I10 Essential (primary) hypertension: Secondary | ICD-10-CM | POA: Diagnosis not present

## 2014-12-02 DIAGNOSIS — E785 Hyperlipidemia, unspecified: Secondary | ICD-10-CM | POA: Diagnosis not present

## 2014-12-02 DIAGNOSIS — Z7982 Long term (current) use of aspirin: Secondary | ICD-10-CM | POA: Diagnosis not present

## 2014-12-02 DIAGNOSIS — Z6839 Body mass index (BMI) 39.0-39.9, adult: Secondary | ICD-10-CM | POA: Diagnosis not present

## 2014-12-02 DIAGNOSIS — G4700 Insomnia, unspecified: Secondary | ICD-10-CM | POA: Diagnosis not present

## 2014-12-02 DIAGNOSIS — E876 Hypokalemia: Secondary | ICD-10-CM | POA: Diagnosis not present

## 2014-12-02 DIAGNOSIS — E669 Obesity, unspecified: Secondary | ICD-10-CM | POA: Diagnosis not present

## 2014-12-25 ENCOUNTER — Other Ambulatory Visit: Payer: Self-pay | Admitting: *Deleted

## 2014-12-25 ENCOUNTER — Other Ambulatory Visit: Payer: Self-pay | Admitting: Cardiovascular Disease

## 2014-12-25 MED ORDER — POTASSIUM CHLORIDE CRYS ER 20 MEQ PO TBCR: 20.0000 meq | EXTENDED_RELEASE_TABLET | Freq: Two times a day (BID) | ORAL | Status: DC

## 2014-12-25 NOTE — Telephone Encounter (Signed)
REFILL 

## 2015-01-16 DIAGNOSIS — Z79899 Other long term (current) drug therapy: Secondary | ICD-10-CM | POA: Diagnosis not present

## 2015-01-16 DIAGNOSIS — I25791 Atherosclerosis of other coronary artery bypass graft(s) with angina pectoris with documented spasm: Secondary | ICD-10-CM | POA: Diagnosis not present

## 2015-01-16 DIAGNOSIS — E782 Mixed hyperlipidemia: Secondary | ICD-10-CM | POA: Diagnosis not present

## 2015-01-16 DIAGNOSIS — E668 Other obesity: Secondary | ICD-10-CM | POA: Diagnosis not present

## 2015-01-16 DIAGNOSIS — I10 Essential (primary) hypertension: Secondary | ICD-10-CM | POA: Diagnosis not present

## 2015-01-16 DIAGNOSIS — E119 Type 2 diabetes mellitus without complications: Secondary | ICD-10-CM | POA: Diagnosis not present

## 2015-01-25 ENCOUNTER — Encounter: Payer: Self-pay | Admitting: Cardiovascular Disease

## 2015-02-08 ENCOUNTER — Other Ambulatory Visit: Payer: Self-pay | Admitting: Cardiovascular Disease

## 2015-02-08 NOTE — Telephone Encounter (Signed)
REFILL 

## 2015-03-13 ENCOUNTER — Other Ambulatory Visit: Payer: Self-pay | Admitting: Cardiovascular Disease

## 2015-03-13 NOTE — Telephone Encounter (Signed)
Rx has been sent to the pharmacy electronically. ° °

## 2015-04-02 ENCOUNTER — Telehealth: Payer: Self-pay | Admitting: Cardiovascular Disease

## 2015-04-02 MED ORDER — POTASSIUM CHLORIDE CRYS ER 20 MEQ PO TBCR: 20.0000 meq | EXTENDED_RELEASE_TABLET | Freq: Two times a day (BID) | ORAL | Status: DC

## 2015-04-02 NOTE — Telephone Encounter (Signed)
Rx(s) sent to pharmacy electronically. Patient notified.  OV 05/15/15 with Dr. Allyson Sabal

## 2015-04-02 NOTE — Telephone Encounter (Signed)
°  1. Which medications need to be refilled? Potassium   2. Which pharmacy is medication to be sent to?[facility] on [address]   3. Do they need a 30 day or 90 day supply? 90  4. Would they like a call back once the medication has been sent to the pharmacy? Yes

## 2015-04-10 DIAGNOSIS — Z6841 Body Mass Index (BMI) 40.0 and over, adult: Secondary | ICD-10-CM | POA: Diagnosis not present

## 2015-04-10 DIAGNOSIS — E119 Type 2 diabetes mellitus without complications: Secondary | ICD-10-CM | POA: Diagnosis not present

## 2015-04-10 DIAGNOSIS — I10 Essential (primary) hypertension: Secondary | ICD-10-CM | POA: Diagnosis not present

## 2015-05-15 ENCOUNTER — Ambulatory Visit: Payer: Medicare HMO | Admitting: Cardiovascular Disease

## 2015-05-15 DIAGNOSIS — Z79899 Other long term (current) drug therapy: Secondary | ICD-10-CM | POA: Diagnosis not present

## 2015-05-15 DIAGNOSIS — J398 Other specified diseases of upper respiratory tract: Secondary | ICD-10-CM | POA: Diagnosis not present

## 2015-05-15 DIAGNOSIS — R062 Wheezing: Secondary | ICD-10-CM | POA: Diagnosis not present

## 2015-05-15 DIAGNOSIS — R05 Cough: Secondary | ICD-10-CM | POA: Diagnosis not present

## 2015-05-15 DIAGNOSIS — I10 Essential (primary) hypertension: Secondary | ICD-10-CM | POA: Diagnosis not present

## 2015-07-22 ENCOUNTER — Telehealth: Payer: Self-pay | Admitting: Cardiovascular Disease

## 2015-07-22 ENCOUNTER — Telehealth: Payer: Self-pay

## 2015-07-22 NOTE — Telephone Encounter (Addendum)
Updated information for MD at [facility] he is seeing. Will forward to [facility] as discussed. Dr. Melynda Keller Phone: [phone] Ext: [phone]

## 2015-07-22 NOTE — Telephone Encounter (Signed)
Trevor Jackson is returning your call .

## 2015-07-22 NOTE — Telephone Encounter (Signed)
Pt contacted as he needs OV to see Dr. Allyson Sabal for clearance. Pt stated he has been seeing Cardiologist at the [facility]. Told pt i would send not to [facility] with that information so that MD can be contacted. If they are unable to clear him Dr. Allyson Sabal will gladly see him to start the clearance process. Pt verbalized understanding, no questions at this time.

## 2015-07-31 DIAGNOSIS — E1121 Type 2 diabetes mellitus with diabetic nephropathy: Secondary | ICD-10-CM | POA: Diagnosis not present

## 2015-07-31 DIAGNOSIS — I129 Hypertensive chronic kidney disease with stage 1 through stage 4 chronic kidney disease, or unspecified chronic kidney disease: Secondary | ICD-10-CM | POA: Diagnosis not present

## 2015-07-31 DIAGNOSIS — N182 Chronic kidney disease, stage 2 (mild): Secondary | ICD-10-CM | POA: Diagnosis not present

## 2015-07-31 DIAGNOSIS — Z79899 Other long term (current) drug therapy: Secondary | ICD-10-CM | POA: Diagnosis not present

## 2015-10-30 DIAGNOSIS — I129 Hypertensive chronic kidney disease with stage 1 through stage 4 chronic kidney disease, or unspecified chronic kidney disease: Secondary | ICD-10-CM | POA: Diagnosis not present

## 2015-10-30 DIAGNOSIS — Z79899 Other long term (current) drug therapy: Secondary | ICD-10-CM | POA: Diagnosis not present

## 2015-10-30 DIAGNOSIS — N182 Chronic kidney disease, stage 2 (mild): Secondary | ICD-10-CM | POA: Diagnosis not present

## 2015-10-30 DIAGNOSIS — E1121 Type 2 diabetes mellitus with diabetic nephropathy: Secondary | ICD-10-CM | POA: Diagnosis not present

## 2016-01-29 DIAGNOSIS — E1121 Type 2 diabetes mellitus with diabetic nephropathy: Secondary | ICD-10-CM | POA: Diagnosis not present

## 2016-01-29 DIAGNOSIS — Z79899 Other long term (current) drug therapy: Secondary | ICD-10-CM | POA: Diagnosis not present

## 2016-01-29 DIAGNOSIS — I129 Hypertensive chronic kidney disease with stage 1 through stage 4 chronic kidney disease, or unspecified chronic kidney disease: Secondary | ICD-10-CM | POA: Diagnosis not present

## 2016-01-29 DIAGNOSIS — N182 Chronic kidney disease, stage 2 (mild): Secondary | ICD-10-CM | POA: Diagnosis not present

## 2016-03-04 DIAGNOSIS — Z79899 Other long term (current) drug therapy: Secondary | ICD-10-CM | POA: Diagnosis not present

## 2016-03-04 DIAGNOSIS — I10 Essential (primary) hypertension: Secondary | ICD-10-CM | POA: Diagnosis not present

## 2016-03-04 DIAGNOSIS — M5117 Intervertebral disc disorders with radiculopathy, lumbosacral region: Secondary | ICD-10-CM | POA: Diagnosis not present

## 2016-04-03 DIAGNOSIS — E1121 Type 2 diabetes mellitus with diabetic nephropathy: Secondary | ICD-10-CM | POA: Diagnosis not present

## 2016-04-03 DIAGNOSIS — Z79899 Other long term (current) drug therapy: Secondary | ICD-10-CM | POA: Diagnosis not present

## 2016-04-03 DIAGNOSIS — N182 Chronic kidney disease, stage 2 (mild): Secondary | ICD-10-CM | POA: Diagnosis not present

## 2016-04-03 DIAGNOSIS — I129 Hypertensive chronic kidney disease with stage 1 through stage 4 chronic kidney disease, or unspecified chronic kidney disease: Secondary | ICD-10-CM | POA: Diagnosis not present

## 2016-04-03 DIAGNOSIS — M5117 Intervertebral disc disorders with radiculopathy, lumbosacral region: Secondary | ICD-10-CM | POA: Diagnosis not present

## 2016-04-06 DIAGNOSIS — Z79899 Other long term (current) drug therapy: Secondary | ICD-10-CM | POA: Diagnosis not present

## 2016-04-06 DIAGNOSIS — Z8241 Family history of sudden cardiac death: Secondary | ICD-10-CM | POA: Diagnosis not present

## 2016-04-06 DIAGNOSIS — H918X3 Other specified hearing loss, bilateral: Secondary | ICD-10-CM | POA: Diagnosis not present

## 2016-04-06 DIAGNOSIS — I129 Hypertensive chronic kidney disease with stage 1 through stage 4 chronic kidney disease, or unspecified chronic kidney disease: Secondary | ICD-10-CM | POA: Diagnosis not present

## 2016-04-06 DIAGNOSIS — I2510 Atherosclerotic heart disease of native coronary artery without angina pectoris: Secondary | ICD-10-CM | POA: Diagnosis not present

## 2016-04-06 DIAGNOSIS — N182 Chronic kidney disease, stage 2 (mild): Secondary | ICD-10-CM | POA: Diagnosis not present

## 2016-04-30 DIAGNOSIS — J4540 Moderate persistent asthma, uncomplicated: Secondary | ICD-10-CM | POA: Diagnosis not present

## 2016-04-30 DIAGNOSIS — N182 Chronic kidney disease, stage 2 (mild): Secondary | ICD-10-CM | POA: Diagnosis not present

## 2016-04-30 DIAGNOSIS — E1121 Type 2 diabetes mellitus with diabetic nephropathy: Secondary | ICD-10-CM | POA: Diagnosis not present

## 2016-04-30 DIAGNOSIS — Z79899 Other long term (current) drug therapy: Secondary | ICD-10-CM | POA: Diagnosis not present

## 2016-04-30 DIAGNOSIS — I129 Hypertensive chronic kidney disease with stage 1 through stage 4 chronic kidney disease, or unspecified chronic kidney disease: Secondary | ICD-10-CM | POA: Diagnosis not present

## 2016-04-30 DIAGNOSIS — M5117 Intervertebral disc disorders with radiculopathy, lumbosacral region: Secondary | ICD-10-CM | POA: Diagnosis not present

## 2016-05-27 DIAGNOSIS — H903 Sensorineural hearing loss, bilateral: Secondary | ICD-10-CM | POA: Diagnosis not present

## 2016-05-27 DIAGNOSIS — H9313 Tinnitus, bilateral: Secondary | ICD-10-CM | POA: Diagnosis not present

## 2016-06-03 DIAGNOSIS — M5117 Intervertebral disc disorders with radiculopathy, lumbosacral region: Secondary | ICD-10-CM | POA: Diagnosis not present

## 2016-06-03 DIAGNOSIS — G894 Chronic pain syndrome: Secondary | ICD-10-CM | POA: Diagnosis not present

## 2016-06-03 DIAGNOSIS — Z79899 Other long term (current) drug therapy: Secondary | ICD-10-CM | POA: Diagnosis not present

## 2016-06-03 DIAGNOSIS — I10 Essential (primary) hypertension: Secondary | ICD-10-CM | POA: Diagnosis not present

## 2016-07-02 DIAGNOSIS — N182 Chronic kidney disease, stage 2 (mild): Secondary | ICD-10-CM | POA: Diagnosis not present

## 2016-07-02 DIAGNOSIS — M5117 Intervertebral disc disorders with radiculopathy, lumbosacral region: Secondary | ICD-10-CM | POA: Diagnosis not present

## 2016-07-02 DIAGNOSIS — Z79899 Other long term (current) drug therapy: Secondary | ICD-10-CM | POA: Diagnosis not present

## 2016-07-02 DIAGNOSIS — I129 Hypertensive chronic kidney disease with stage 1 through stage 4 chronic kidney disease, or unspecified chronic kidney disease: Secondary | ICD-10-CM | POA: Diagnosis not present

## 2016-07-02 DIAGNOSIS — G894 Chronic pain syndrome: Secondary | ICD-10-CM | POA: Diagnosis not present

## 2016-08-03 DIAGNOSIS — G894 Chronic pain syndrome: Secondary | ICD-10-CM | POA: Diagnosis not present

## 2016-08-03 DIAGNOSIS — Z79899 Other long term (current) drug therapy: Secondary | ICD-10-CM | POA: Diagnosis not present

## 2016-08-03 DIAGNOSIS — I10 Essential (primary) hypertension: Secondary | ICD-10-CM | POA: Diagnosis not present

## 2016-08-03 DIAGNOSIS — M5117 Intervertebral disc disorders with radiculopathy, lumbosacral region: Secondary | ICD-10-CM | POA: Diagnosis not present

## 2016-08-25 DIAGNOSIS — B029 Zoster without complications: Secondary | ICD-10-CM | POA: Diagnosis not present

## 2016-08-25 DIAGNOSIS — B0223 Postherpetic polyneuropathy: Secondary | ICD-10-CM | POA: Diagnosis not present

## 2016-08-31 DIAGNOSIS — G894 Chronic pain syndrome: Secondary | ICD-10-CM | POA: Diagnosis not present

## 2016-08-31 DIAGNOSIS — B029 Zoster without complications: Secondary | ICD-10-CM | POA: Diagnosis not present

## 2016-09-29 DIAGNOSIS — H40013 Open angle with borderline findings, low risk, bilateral: Secondary | ICD-10-CM | POA: Diagnosis not present

## 2016-09-29 DIAGNOSIS — E119 Type 2 diabetes mellitus without complications: Secondary | ICD-10-CM | POA: Diagnosis not present

## 2016-10-01 DIAGNOSIS — Z6841 Body Mass Index (BMI) 40.0 and over, adult: Secondary | ICD-10-CM | POA: Diagnosis not present

## 2016-10-01 DIAGNOSIS — M5117 Intervertebral disc disorders with radiculopathy, lumbosacral region: Secondary | ICD-10-CM | POA: Diagnosis not present

## 2016-10-01 DIAGNOSIS — I10 Essential (primary) hypertension: Secondary | ICD-10-CM | POA: Diagnosis not present

## 2016-10-01 DIAGNOSIS — Z79899 Other long term (current) drug therapy: Secondary | ICD-10-CM | POA: Diagnosis not present

## 2016-10-01 DIAGNOSIS — G894 Chronic pain syndrome: Secondary | ICD-10-CM | POA: Diagnosis not present

## 2016-10-16 DIAGNOSIS — M1040 Other secondary gout, unspecified site: Secondary | ICD-10-CM | POA: Diagnosis not present

## 2016-10-16 DIAGNOSIS — R2241 Localized swelling, mass and lump, right lower limb: Secondary | ICD-10-CM | POA: Diagnosis not present

## 2016-10-16 DIAGNOSIS — M79671 Pain in right foot: Secondary | ICD-10-CM | POA: Diagnosis not present

## 2016-10-29 DIAGNOSIS — G894 Chronic pain syndrome: Secondary | ICD-10-CM | POA: Diagnosis not present

## 2016-11-30 DIAGNOSIS — I10 Essential (primary) hypertension: Secondary | ICD-10-CM | POA: Diagnosis not present

## 2016-11-30 DIAGNOSIS — G894 Chronic pain syndrome: Secondary | ICD-10-CM | POA: Diagnosis not present

## 2016-11-30 DIAGNOSIS — M1040 Other secondary gout, unspecified site: Secondary | ICD-10-CM | POA: Diagnosis not present

## 2016-11-30 DIAGNOSIS — Z79899 Other long term (current) drug therapy: Secondary | ICD-10-CM | POA: Diagnosis not present

## 2016-12-28 DIAGNOSIS — I10 Essential (primary) hypertension: Secondary | ICD-10-CM | POA: Diagnosis not present

## 2016-12-28 DIAGNOSIS — G894 Chronic pain syndrome: Secondary | ICD-10-CM | POA: Diagnosis not present

## 2016-12-28 DIAGNOSIS — M1040 Other secondary gout, unspecified site: Secondary | ICD-10-CM | POA: Diagnosis not present

## 2017-03-03 DIAGNOSIS — M109 Gout, unspecified: Secondary | ICD-10-CM | POA: Diagnosis not present

## 2017-03-04 ENCOUNTER — Ambulatory Visit (INDEPENDENT_AMBULATORY_CARE_PROVIDER_SITE_OTHER): Payer: Medicare HMO

## 2017-03-04 ENCOUNTER — Encounter: Payer: Self-pay | Admitting: Physician Assistant

## 2017-03-04 ENCOUNTER — Ambulatory Visit (INDEPENDENT_AMBULATORY_CARE_PROVIDER_SITE_OTHER): Payer: Medicare HMO | Admitting: Physician Assistant

## 2017-03-04 ENCOUNTER — Ambulatory Visit: Payer: Medicare HMO

## 2017-03-04 VITALS — BP 133/86 | HR 64

## 2017-03-04 DIAGNOSIS — M542 Cervicalgia: Secondary | ICD-10-CM | POA: Diagnosis not present

## 2017-03-04 DIAGNOSIS — M79605 Pain in left leg: Secondary | ICD-10-CM

## 2017-03-04 DIAGNOSIS — S0091XA Abrasion of unspecified part of head, initial encounter: Secondary | ICD-10-CM

## 2017-03-04 DIAGNOSIS — S8992XA Unspecified injury of left lower leg, initial encounter: Secondary | ICD-10-CM | POA: Diagnosis not present

## 2017-03-04 DIAGNOSIS — M79662 Pain in left lower leg: Secondary | ICD-10-CM | POA: Diagnosis not present

## 2017-03-04 MED ORDER — METHOCARBAMOL 750 MG PO TABS: 750.0000 mg | ORAL_TABLET | Freq: Three times a day (TID) | ORAL | 1 refills | Status: DC | PRN
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
## 2017-03-09 ENCOUNTER — Telehealth: Payer: Self-pay | Admitting: Radiology
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
## 2017-03-31 DIAGNOSIS — G4700 Insomnia, unspecified: Secondary | ICD-10-CM | POA: Diagnosis not present

## 2017-03-31 DIAGNOSIS — M79605 Pain in left leg: Secondary | ICD-10-CM | POA: Diagnosis not present

## 2017-04-29 DIAGNOSIS — Z6841 Body Mass Index (BMI) 40.0 and over, adult: Secondary | ICD-10-CM | POA: Diagnosis not present

## 2017-04-29 DIAGNOSIS — N182 Chronic kidney disease, stage 2 (mild): Secondary | ICD-10-CM | POA: Diagnosis not present

## 2017-04-29 DIAGNOSIS — E1121 Type 2 diabetes mellitus with diabetic nephropathy: Secondary | ICD-10-CM | POA: Diagnosis not present

## 2017-04-29 DIAGNOSIS — Z79899 Other long term (current) drug therapy: Secondary | ICD-10-CM | POA: Diagnosis not present

## 2017-04-29 DIAGNOSIS — M5117 Intervertebral disc disorders with radiculopathy, lumbosacral region: Secondary | ICD-10-CM | POA: Diagnosis not present

## 2017-04-29 DIAGNOSIS — I129 Hypertensive chronic kidney disease with stage 1 through stage 4 chronic kidney disease, or unspecified chronic kidney disease: Secondary | ICD-10-CM | POA: Diagnosis not present

## 2017-05-19 DIAGNOSIS — Z79899 Other long term (current) drug therapy: Secondary | ICD-10-CM | POA: Diagnosis not present

## 2017-05-19 DIAGNOSIS — E1121 Type 2 diabetes mellitus with diabetic nephropathy: Secondary | ICD-10-CM | POA: Diagnosis not present

## 2017-05-19 DIAGNOSIS — M62830 Muscle spasm of back: Secondary | ICD-10-CM | POA: Diagnosis not present

## 2017-05-19 DIAGNOSIS — I10 Essential (primary) hypertension: Secondary | ICD-10-CM | POA: Diagnosis not present

## 2017-05-31 DIAGNOSIS — Z79899 Other long term (current) drug therapy: Secondary | ICD-10-CM | POA: Diagnosis not present

## 2017-05-31 DIAGNOSIS — I129 Hypertensive chronic kidney disease with stage 1 through stage 4 chronic kidney disease, or unspecified chronic kidney disease: Secondary | ICD-10-CM | POA: Diagnosis not present

## 2017-05-31 DIAGNOSIS — M545 Low back pain: Secondary | ICD-10-CM | POA: Diagnosis not present

## 2017-06-11 ENCOUNTER — Ambulatory Visit: Payer: Medicare HMO | Admitting: Emergency Medicine

## 2017-06-11 ENCOUNTER — Other Ambulatory Visit: Payer: Self-pay

## 2017-06-11 ENCOUNTER — Encounter: Payer: Self-pay | Admitting: Emergency Medicine

## 2017-06-11 VITALS — BP 136/78 | HR 72 | Temp 98.8°F | Resp 16 | Ht 65.0 in | Wt 253.6 lb

## 2017-06-11 DIAGNOSIS — S39012A Strain of muscle, fascia and tendon of lower back, initial encounter: Secondary | ICD-10-CM | POA: Insufficient documentation

## 2017-06-11 DIAGNOSIS — M545 Low back pain, unspecified: Secondary | ICD-10-CM | POA: Insufficient documentation

## 2017-06-11 MED ORDER — HYDROCODONE-ACETAMINOPHEN 5-325 MG PO TABS: 1.0000 | ORAL_TABLET | Freq: Four times a day (QID) | ORAL | 0 refills | Status: DC | PRN

## 2017-06-11 NOTE — Progress Notes (Signed)
Trevor Jackson 56 y.o.   Chief Complaint  Patient presents with  . Back Pain    RIGHT LOWER since 06/10/17    HISTORY OF PRESENT ILLNESS: This is a 56 y.o. male complaining of right lower back pain since yesterday.   Back Pain  This is a new problem. The current episode started yesterday. The problem occurs constantly. The problem has been waxing and waning since onset. The pain is present in the lumbar spine. The quality of the pain is described as aching. The pain does not radiate. The pain is at a severity of 7/10. The pain is moderate. The symptoms are aggravated by bending, position, lying down and twisting. Stiffness is present all day. Pertinent negatives include no abdominal pain, bladder incontinence, bowel incontinence, chest pain, dysuria, fever, headaches, leg pain, numbness, paresis, paresthesias, pelvic pain, perianal numbness, tingling, weakness or weight loss. Risk factors: none. He has tried chiropractic manipulation, heat and bed rest for the symptoms. The treatment provided mild relief.     Prior to Admission medications   Medication Sig Start Date End Date Taking? Authorizing Provider  albuterol (VENTOLIN HFA) 108 (90 BASE) MCG/ACT inhaler Inhale 2 puffs into the lungs every 6 (six) hours as needed. For shortness of breath    [provider]  aspirin EC 325 MG EC tablet Take 1 tablet (325 mg total) by mouth daily. 06/13/12   Gold, Glenice Laine, PA-C  buPROPion (WELLBUTRIN) 100 MG tablet Take 100 mg by mouth 2 (two) times daily.    [provider]  Cholecalciferol (VITAMIN D PO) Take 1 tablet by mouth daily.    [provider]  cyclobenzaprine (FLEXERIL) 10 MG tablet Take 1 tablet (10 mg total) by mouth at bedtime. Patient not taking: Reported on 03/04/2017 01/27/14   Tonye Pearson, MD  Dapagliflozin Propanediol (FARXIGA) 10 MG TABS Take 10 mg by mouth daily.    [provider]  Exenatide (BYDUREON) 2 MG SUSR Inject 2 mg into the skin  once a week.    [provider]  furosemide (LASIX) 40 MG tablet take 1 tablet by mouth once daily 08/01/14   Croitoru, Mihai, MD  HYDROcodone-acetaminophen (NORCO/VICODIN) 5-325 MG per tablet Take 1 tablet by mouth every 6 (six) hours as needed for moderate pain or severe pain. 08/10/14   Linna Hoff, MD  insulin detemir (LEVEMIR) 100 UNIT/ML injection Inject 40 Units into the skin at bedtime.    [provider]  Linagliptin-Metformin HCl 2.5-500 MG TABS Take 1 tablet by mouth 2 (two) times daily.     [provider]  losartan (COZAAR) 100 MG tablet Take 1 tablet (100 mg total) by mouth daily. 06/14/12   Collins, Debby Freiberg, PA-C  meloxicam (MOBIC) 15 MG tablet Take 1 tablet (15 mg total) by mouth daily. Patient not taking: Reported on 03/04/2017 01/27/14   Tonye Pearson, MD  methocarbamol (ROBAXIN) 750 MG tablet Take 1 tablet (750 mg total) by mouth every 8 (eight) hours as needed for muscle spasms. 03/04/17   Trena Platt D, PA  metoprolol tartrate (LOPRESSOR) 25 MG tablet Take 12.5 mg by mouth 2 (two) times daily. 06/13/12   Rowe Clack, PA-C  Multiple Vitamins-Minerals (MULTIVITAMINS THER. W/MINERALS) TABS Take 1 tablet by mouth daily.    [provider]  niacin (NIASPAN) 500 MG CR tablet Take 500 mg by mouth 2 (two) times daily.     [provider]  Niacin CR 1000 MG TBCR  10/28/13  [provider]  potassium chloride SA (K-DUR,KLOR-CON) 20 MEQ tablet Take 1 tablet (20 mEq total) by mouth 2 (two) times daily. NEED OV. Patient not taking: Reported on 03/04/2017 02/08/15   Runell Gess, MD  potassium chloride SA (K-DUR,KLOR-CON) 20 MEQ tablet Take 1 tablet (20 mEq total) by mouth 2 (two) times daily. Patient not taking: Reported on 03/04/2017 04/02/15   Runell Gess, MD  rosuvastatin (CRESTOR) 40 MG tablet Take 20 mg by mouth 2 (two) times daily. Takes two 20mg  tablets daily 06/14/12   Coral Ceo L, PA-C  sertraline (ZOLOFT) 100 MG  tablet Take 100 mg by mouth daily.    [provider]  venlafaxine XR (EFFEXOR XR) 37.5 MG 24 hr capsule Take 37.5 mg by mouth daily.    [provider]  vitamin C (ASCORBIC ACID) 500 MG tablet Take 500 mg by mouth daily.    [provider]  zolpidem (AMBIEN) 10 MG tablet Take 10 mg by mouth at bedtime as needed. For sleep    [provider]    No Known Allergies  Patient Active Problem List   Diagnosis Date Noted  . 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 Coronary atherosclerosis of native coronary artery 06/06/2012  . Coronary artery disease 05/18/2012  . CAD (coronary artery disease) 05/18/2012  . Morbid obesity with BMI of 40.0-44.9, adult (HCC) 05/18/2012  . Hypertension 05/18/2012  . Hyperlipidemia 05/18/2012  . Headache(784.0) 05/18/2012  . Anxiety and depression 05/18/2012  . Obstructive sleep apnea 05/18/2012  . Multiple pulmonary nodules 02/25/2012  . Cough 02/18/2012  . Dyspnea 02/18/2012    Past Medical History:  Diagnosis Date  . Anxiety   . Anxiety and depression 05/18/2012  . Benign head tremor   . CHF (congestive heart failure) (HCC)   . Coronary artery disease 05/18/2012  . Diabetes mellitus   . Headache(784.0) 05/18/2012  . Hyperlipidemia   . Hypertension    dr berry  . Morbid obesity with BMI of 40.0-44.9, adult (HCC) 05/18/2012  . Myocardial infarction (HCC) 01/07/2011  . Obstructive sleep apnea 05/18/2012   On CPAP  . OSA (obstructive sleep apnea)   . 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 CORONARY ANGIOPLASTY WITH STENT PLACEMENT  01-07-11  . CORONARY ARTERY BYPASS GRAFT  06/08/2012   Procedure: CORONARY ARTERY BYPASS GRAFTING (CABG);  Surgeon: Purcell Nails, MD;  Location: [facility] OR;  Service: Open Heart Surgery;  Laterality: N/A;  . kidney stone removal    . LEFT HEART CATHETERIZATION WITH CORONARY ANGIOGRAM N/A 05/13/2012     Procedure: LEFT HEART CATHETERIZATION WITH CORONARY ANGIOGRAM;  Surgeon: Marykay Lex, MD;  Location: [facility] CATH LAB;  Service: Cardiovascular;  Laterality: N/A;  . RADIAL ARTERY HARVEST  06/08/2012   Procedure: RADIAL ARTERY HARVEST;  Surgeon: Purcell Nails, MD;  Location: [facility] OR;  Service: Open Heart Surgery;  Laterality: Left;  . SHOULDER SURGERY  2008    Social History   Socioeconomic History  . Marital status: Married    Spouse name: Not on file  . Number of children: 2  . Years of education: Not on file  . Highest education level: Not on file  Social Needs  . Financial resource strain: Not on file  . Food insecurity - worry: Not on file  . Food insecurity - inability: Not on file  .  Transportation needs - medical: Not on file  . Transportation needs - non-medical: Not on file  Occupational History  . Occupation: disabled after MI    Employer: NOT EMPLOYED  Tobacco Use  . Smoking status: Never Smoker  . Smokeless tobacco: Never Used  Substance and Sexual Activity  . Alcohol use: No  . Drug use: No  . Sexual activity: Not on file  Other Topics Concern  . Not on file  Social History Narrative   On disability since heart attack in 2012, previously employed as Retail bankeraircraft mechanic    Family History  Problem Relation Age of Onset  . Heart disease Mother   . Hyperlipidemia Mother   . Hypertension Mother   . Heart attack Mother   . Heart disease Sister   . Bone cancer Father   . Heart attack Father   . Diabetes Sister   . Hyperlipidemia Sister   . Hypertension Sister   . Prostate cancer Brother   . Heart attack Brother   . Hyperlipidemia Brother   . Hypertension Brother      Review of Systems  Constitutional: Negative for chills, fever and weight loss.  HENT: Negative.   Eyes: Negative.   Respiratory: Negative for cough and shortness of breath.   Cardiovascular: Negative for chest pain and palpitations.  Gastrointestinal: Negative for abdominal pain, blood  in stool, bowel incontinence, constipation, diarrhea, nausea and vomiting.  Genitourinary: Negative for bladder incontinence, dysuria, hematuria and pelvic pain.  Musculoskeletal: Positive for back pain.  Skin: Negative for rash.  Neurological: Negative for dizziness, tingling, sensory change, focal weakness, weakness, numbness, headaches and paresthesias.  Endo/Heme/Allergies: Negative.   All other systems reviewed and are negative.   Vitals:   06/11/17 0809  BP: 136/78  Pulse: 72  Resp: 16  Temp: 98.8 F (37.1 C)  SpO2: 98%    Physical Exam  Constitutional: He is oriented to person, place, and time. He appears well-developed and well-nourished.  HENT:  Head: Normocephalic and atraumatic.  Eyes: Conjunctivae and EOM are normal. Pupils are equal, round, and reactive to light.  Neck: Normal range of motion. Neck supple.  Cardiovascular: Normal rate, regular rhythm, normal heart sounds and intact distal pulses.  Pulmonary/Chest: Effort normal and breath sounds normal.  Abdominal: Soft. Bowel sounds are normal. He exhibits no distension. There is no tenderness.  Musculoskeletal:       Lumbar back: He exhibits decreased range of motion, tenderness and spasm. He exhibits no bony tenderness and normal pulse.       Back:  Neurological: He is alert and oriented to person, place, and time. He displays normal reflexes. No sensory deficit. He exhibits normal muscle tone.  Skin: Skin is warm and dry. Capillary refill takes less than 2 seconds. No rash noted.  CABG surgery scars on chest. Vein grafting scar on left forearm.  Psychiatric: He has a normal mood and affect. His behavior is normal.  Vitals reviewed.    ASSESSMENT & PLAN: Perlie GoldRussell was seen today for back pain.  Diagnoses and all orders for this visit:  Acute right-sided low back pain without sciatica -     HYDROcodone-acetaminophen (NORCO) 5-325 MG tablet; Take 1 tablet by mouth every 6 (six) hours as needed for severe  pain.  Acute myofascial strain of lumbar region, initial encounter   Also take muscle relaxants you have at home.  Patient Instructions       IF you received an x-ray today, you will receive an invoice from [facility]. Please contact [facility] at [phone] with questions or concerns regarding your invoice.   IF you received labwork today, you will receive an invoice from [facility]. Please contact [facility] at [phone] with questions or concerns regarding your invoice.   Our billing staff will not be able to assist you with questions regarding bills from these companies.  You will be contacted with the lab results as soon as they are available. The fastest way to get your results is to activate your My Chart account. Instructions are located on the last page of this paperwork. If you have not heard from us regarding the results in 2 weeks, please contact this office.     Back Pain, Adult Back pain is very common. The pain often gets better over time. The cause of back pain is usually not dangerous. Most people can learn to manage their back pain on their own. Follow these instructions at home: Watch your back pain for any changes. The following actions may help to lessen any pain you are feeling:  Stay active. Start with short walks on flat ground if you can. Try to walk farther each day.  Exercise regularly as told by your doctor. Exercise helps your back heal faster. It also helps avoid future injury by keeping your muscles strong and flexible.  Do not sit, drive, or stand in one place for more than 30 minutes.  Do not stay in bed. Resting more than 1-2 days can slow down your recovery.  Be careful when you bend or lift an object. Use good form when lifting: ? Bend at your knees. ? Keep the object close to your body. ? Do not twist.  Sleep on a firm mattress. Lie on your side, and bend your knees. If you lie on your back, put a pillow under your  knees.  Take medicines only as told by your doctor.  Put ice on the injured area. ? Put ice in a plastic bag. ? Place a towel between your skin and the bag. ? Leave the ice on for 20 minutes, 2-3 times a day for the first 2-3 days. After that, you can switch between ice and heat packs.  Avoid feeling anxious or stressed. Find good ways to deal with stress, such as exercise.  Maintain a healthy weight. Extra weight puts stress on your back.  Contact a doctor if:  You have pain that does not go away with rest or medicine.  You have worsening pain that goes down into your legs or buttocks.  You have pain that does not get better in one week.  You have pain at night.  You lose weight.  You have a fever or chills. Get help right away if:  You cannot control when you poop (bowel movement) or pee (urinate).  Your arms or legs feel weak.  Your arms or legs lose feeling (numbness).  You feel sick to your stomach (nauseous) or throw up (vomit).  You have belly (abdominal) pain.  You feel like you may pass out (faint). This information is not intended to replace advice given to you by your health care provider. Make sure you discuss any questions you have with your health care provider. Document Released: 12/02/2007 Document Revised: 11/21/2015 Document Reviewed: 10/17/2013 Elsevier Interactive Patient Education  2018 Elsevier Inc.      Edwina BarthMiguel Jackelin Correia, MD [facility]

## 2017-06-11 NOTE — Patient Instructions (Addendum)
     IF you received an x-ray today, you will receive an invoice from [facility]. Please contact [facility] at [phone] with questions or concerns regarding your invoice.   IF you received labwork today, you will receive an invoice from [facility]. Please contact [facility] at [phone] with questions or concerns regarding your invoice.   Our billing staff will not be able to assist you with questions regarding bills from these companies.  You will be contacted with the lab results as soon as they are available. The fastest way to get your results is to activate your My Chart account. Instructions are located on the last page of this paperwork. If you have not heard from us regarding the results in 2 weeks, please contact this office.      Back Pain, Adult Back pain is very common. The pain often gets better over time. The cause of back pain is usually not dangerous. Most people can learn to manage their back pain on their own. Follow these instructions at home: Watch your back pain for any changes. The following actions may help to lessen any pain you are feeling:  Stay active. Start with short walks on flat ground if you can. Try to walk farther each day.  Exercise regularly as told by your doctor. Exercise helps your back heal faster. It also helps avoid future injury by keeping your muscles strong and flexible.  Do not sit, drive, or stand in one place for more than 30 minutes.  Do not stay in bed. Resting more than 1-2 days can slow down your recovery.  Be careful when you bend or lift an object. Use good form when lifting: ? Bend at your knees. ? Keep the object close to your body. ? Do not twist.  Sleep on a firm mattress. Lie on your side, and bend your knees. If you lie on your back, put a pillow under your knees.  Take medicines only as told by your doctor.  Put ice on the injured area. ? Put ice in a plastic bag. ? Place a towel between your  skin and the bag. ? Leave the ice on for 20 minutes, 2-3 times a day for the first 2-3 days. After that, you can switch between ice and heat packs.  Avoid feeling anxious or stressed. Find good ways to deal with stress, such as exercise.  Maintain a healthy weight. Extra weight puts stress on your back.  Contact a doctor if:  You have pain that does not go away with rest or medicine.  You have worsening pain that goes down into your legs or buttocks.  You have pain that does not get better in one week.  You have pain at night.  You lose weight.  You have a fever or chills. Get help right away if:  You cannot control when you poop (bowel movement) or pee (urinate).  Your arms or legs feel weak.  Your arms or legs lose feeling (numbness).  You feel sick to your stomach (nauseous) or throw up (vomit).  You have belly (abdominal) pain.  You feel like you may pass out (faint). This information is not intended to replace advice given to you by your health care provider. Make sure you discuss any questions you have with your health care provider. Document Released: 12/02/2007 Document Revised: 11/21/2015 Document Reviewed: 10/17/2013 Elsevier Interactive Patient Education  2018 Elsevier Inc.  

## 2017-06-23 ENCOUNTER — Encounter: Payer: Self-pay | Admitting: Emergency Medicine

## 2017-06-23 ENCOUNTER — Ambulatory Visit: Payer: Medicare HMO | Admitting: Emergency Medicine

## 2017-06-23 VITALS — BP 136/86 | HR 74 | Temp 97.6°F | Resp 18 | Ht 65.0 in | Wt 257.0 lb

## 2017-06-23 DIAGNOSIS — M545 Low back pain, unspecified: Secondary | ICD-10-CM

## 2017-06-23 MED ORDER — PREDNISONE 20 MG PO TABS: 40.0000 mg | ORAL_TABLET | Freq: Every day | ORAL | 0 refills | Status: AC

## 2017-06-23 NOTE — Patient Instructions (Addendum)
     IF you received an x-ray today, you will receive an invoice from [facility]. Please contact [facility] at [phone] with questions or concerns regarding your invoice.   IF you received labwork today, you will receive an invoice from [facility]. Please contact [facility] at [phone] with questions or concerns regarding your invoice.   Our billing staff will not be able to assist you with questions regarding bills from these companies.  You will be contacted with the lab results as soon as they are available. The fastest way to get your results is to activate your My Chart account. Instructions are located on the last page of this paperwork. If you have not heard from us regarding the results in 2 weeks, please contact this office.      Back Pain, Adult Back pain is very common. The pain often gets better over time. The cause of back pain is usually not dangerous. Most people can learn to manage their back pain on their own. Follow these instructions at home: Watch your back pain for any changes. The following actions may help to lessen any pain you are feeling:  Stay active. Start with short walks on flat ground if you can. Try to walk farther each day.  Exercise regularly as told by your doctor. Exercise helps your back heal faster. It also helps avoid future injury by keeping your muscles strong and flexible.  Do not sit, drive, or stand in one place for more than 30 minutes.  Do not stay in bed. Resting more than 1-2 days can slow down your recovery.  Be careful when you bend or lift an object. Use good form when lifting: ? Bend at your knees. ? Keep the object close to your body. ? Do not twist.  Sleep on a firm mattress. Lie on your side, and bend your knees. If you lie on your back, put a pillow under your knees.  Take medicines only as told by your doctor.  Put ice on the injured area. ? Put ice in a plastic bag. ? Place a towel between your  skin and the bag. ? Leave the ice on for 20 minutes, 2-3 times a day for the first 2-3 days. After that, you can switch between ice and heat packs.  Avoid feeling anxious or stressed. Find good ways to deal with stress, such as exercise.  Maintain a healthy weight. Extra weight puts stress on your back.  Contact a doctor if:  You have pain that does not go away with rest or medicine.  You have worsening pain that goes down into your legs or buttocks.  You have pain that does not get better in one week.  You have pain at night.  You lose weight.  You have a fever or chills. Get help right away if:  You cannot control when you poop (bowel movement) or pee (urinate).  Your arms or legs feel weak.  Your arms or legs lose feeling (numbness).  You feel sick to your stomach (nauseous) or throw up (vomit).  You have belly (abdominal) pain.  You feel like you may pass out (faint). This information is not intended to replace advice given to you by your health care provider. Make sure you discuss any questions you have with your health care provider. Document Released: 12/02/2007 Document Revised: 11/21/2015 Document Reviewed: 10/17/2013 Elsevier Interactive Patient Education  2018 Elsevier Inc.  
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
## 2017-07-01 DIAGNOSIS — M545 Low back pain: Secondary | ICD-10-CM | POA: Diagnosis not present

## 2017-07-01 DIAGNOSIS — M5117 Intervertebral disc disorders with radiculopathy, lumbosacral region: Secondary | ICD-10-CM | POA: Diagnosis not present

## 2017-07-01 DIAGNOSIS — M62830 Muscle spasm of back: Secondary | ICD-10-CM | POA: Diagnosis not present

## 2017-07-01 DIAGNOSIS — G894 Chronic pain syndrome: Secondary | ICD-10-CM | POA: Diagnosis not present

## 2017-07-01 DIAGNOSIS — Z79899 Other long term (current) drug therapy: Secondary | ICD-10-CM | POA: Diagnosis not present

## 2017-07-06 ENCOUNTER — Ambulatory Visit
Admission: RE | Admit: 2017-07-06 | Discharge: 2017-07-06 | Disposition: A | Discharge status: Home / Self Care | Payer: Medicare HMO | Source: Ambulatory Visit | Attending: Emergency Medicine | Admitting: Emergency Medicine

## 2017-07-06 DIAGNOSIS — M545 Low back pain, unspecified: Secondary | ICD-10-CM

## 2017-07-06 DIAGNOSIS — M48061 Spinal stenosis, lumbar region without neurogenic claudication: Secondary | ICD-10-CM | POA: Diagnosis not present

## 2017-07-07 ENCOUNTER — Encounter: Payer: Self-pay | Admitting: Radiology

## 2017-07-09 ENCOUNTER — Telehealth: Payer: Self-pay | Admitting: Emergency Medicine

## 2017-07-09 NOTE — Telephone Encounter (Signed)
No significant findings patient needs follow up with ortho.   OK TO GIVE RESULTS ABOVE.

## 2017-07-09 NOTE — Telephone Encounter (Signed)
Copied from CRM [id number]. Topic: General - Other >> Jul 09, 2017 10:01 AM Viviann SpareWhite, Selina wrote: Reason for CRM: Patient called to get the results of his Xray that was done on 07/06/17. Patient is requesting a call back asap @ [phone].

## 2017-07-23 DIAGNOSIS — M545 Low back pain: Secondary | ICD-10-CM | POA: Diagnosis not present

## 2017-08-02 DIAGNOSIS — M5117 Intervertebral disc disorders with radiculopathy, lumbosacral region: Secondary | ICD-10-CM | POA: Diagnosis not present

## 2017-08-02 DIAGNOSIS — Z79899 Other long term (current) drug therapy: Secondary | ICD-10-CM | POA: Diagnosis not present

## 2017-08-02 DIAGNOSIS — G894 Chronic pain syndrome: Secondary | ICD-10-CM | POA: Diagnosis not present

## 2017-08-02 DIAGNOSIS — M545 Low back pain: Secondary | ICD-10-CM | POA: Diagnosis not present

## 2017-08-30 DIAGNOSIS — I10 Essential (primary) hypertension: Secondary | ICD-10-CM | POA: Diagnosis not present

## 2017-08-30 DIAGNOSIS — M545 Low back pain: Secondary | ICD-10-CM | POA: Diagnosis not present

## 2017-08-30 DIAGNOSIS — E669 Obesity, unspecified: Secondary | ICD-10-CM | POA: Diagnosis not present

## 2017-08-30 DIAGNOSIS — M5117 Intervertebral disc disorders with radiculopathy, lumbosacral region: Secondary | ICD-10-CM | POA: Diagnosis not present

## 2017-08-30 DIAGNOSIS — B356 Tinea cruris: Secondary | ICD-10-CM | POA: Diagnosis not present

## 2017-08-30 DIAGNOSIS — Z79899 Other long term (current) drug therapy: Secondary | ICD-10-CM | POA: Diagnosis not present

## 2017-08-31 DIAGNOSIS — M5136 Other intervertebral disc degeneration, lumbar region: Secondary | ICD-10-CM | POA: Diagnosis not present

## 2017-09-14 DIAGNOSIS — M5136 Other intervertebral disc degeneration, lumbar region: Secondary | ICD-10-CM | POA: Diagnosis not present

## 2017-09-27 ENCOUNTER — Encounter: Payer: Self-pay | Admitting: Physician Assistant

## 2017-09-27 DIAGNOSIS — M545 Low back pain: Secondary | ICD-10-CM | POA: Diagnosis not present

## 2017-10-28 DIAGNOSIS — M545 Low back pain: Secondary | ICD-10-CM | POA: Diagnosis not present

## 2017-11-29 DIAGNOSIS — M545 Low back pain: Secondary | ICD-10-CM | POA: Diagnosis not present

## 2017-12-27 DIAGNOSIS — I10 Essential (primary) hypertension: Secondary | ICD-10-CM | POA: Diagnosis not present

## 2017-12-27 DIAGNOSIS — E669 Obesity, unspecified: Secondary | ICD-10-CM | POA: Diagnosis not present

## 2017-12-27 DIAGNOSIS — M545 Low back pain: Secondary | ICD-10-CM | POA: Diagnosis not present

## 2017-12-27 DIAGNOSIS — Z79899 Other long term (current) drug therapy: Secondary | ICD-10-CM | POA: Diagnosis not present

## 2017-12-27 DIAGNOSIS — G894 Chronic pain syndrome: Secondary | ICD-10-CM | POA: Diagnosis not present

## 2018-01-24 DIAGNOSIS — Z79891 Long term (current) use of opiate analgesic: Secondary | ICD-10-CM | POA: Diagnosis not present

## 2018-01-24 DIAGNOSIS — G894 Chronic pain syndrome: Secondary | ICD-10-CM | POA: Diagnosis not present

## 2018-02-24 DIAGNOSIS — Z79891 Long term (current) use of opiate analgesic: Secondary | ICD-10-CM | POA: Diagnosis not present

## 2018-02-24 DIAGNOSIS — M545 Low back pain: Secondary | ICD-10-CM | POA: Diagnosis not present

## 2018-03-28 DIAGNOSIS — M545 Low back pain: Secondary | ICD-10-CM | POA: Diagnosis not present

## 2018-03-28 DIAGNOSIS — Z79891 Long term (current) use of opiate analgesic: Secondary | ICD-10-CM | POA: Diagnosis not present

## 2018-04-25 DIAGNOSIS — Z6841 Body Mass Index (BMI) 40.0 and over, adult: Secondary | ICD-10-CM | POA: Diagnosis not present

## 2018-04-25 DIAGNOSIS — G894 Chronic pain syndrome: Secondary | ICD-10-CM | POA: Diagnosis not present

## 2018-04-25 DIAGNOSIS — I10 Essential (primary) hypertension: Secondary | ICD-10-CM | POA: Diagnosis not present

## 2018-04-25 DIAGNOSIS — E1121 Type 2 diabetes mellitus with diabetic nephropathy: Secondary | ICD-10-CM | POA: Diagnosis not present

## 2018-05-23 DIAGNOSIS — I10 Essential (primary) hypertension: Secondary | ICD-10-CM | POA: Diagnosis not present

## 2018-05-23 DIAGNOSIS — G894 Chronic pain syndrome: Secondary | ICD-10-CM | POA: Diagnosis not present

## 2018-05-23 DIAGNOSIS — Z79891 Long term (current) use of opiate analgesic: Secondary | ICD-10-CM | POA: Diagnosis not present

## 2018-06-07 ENCOUNTER — Telehealth: Payer: Self-pay | Admitting: Emergency Medicine

## 2018-06-07 NOTE — Telephone Encounter (Signed)
I left a message asking the patient to call me at [phone]. Patient still showing as patient of [facility] but hasn't been seen here in over 3 years. Trying to verify patient's PCP. VDM (DD)

## 2018-06-10 ENCOUNTER — Telehealth: Payer: Self-pay | Admitting: Nurse Practitioner

## 2018-06-10 NOTE — Telephone Encounter (Signed)
I spoke with the patient who confirmed that his PCP is Malachy Chamberakia Starkes at [facility]. VDM (DD)

## 2018-06-20 DIAGNOSIS — Z79891 Long term (current) use of opiate analgesic: Secondary | ICD-10-CM | POA: Diagnosis not present

## 2018-06-20 DIAGNOSIS — G894 Chronic pain syndrome: Secondary | ICD-10-CM | POA: Diagnosis not present

## 2018-07-25 DIAGNOSIS — M545 Low back pain: Secondary | ICD-10-CM | POA: Diagnosis not present

## 2018-08-25 DIAGNOSIS — Z79899 Other long term (current) drug therapy: Secondary | ICD-10-CM | POA: Diagnosis not present

## 2018-08-25 DIAGNOSIS — J069 Acute upper respiratory infection, unspecified: Secondary | ICD-10-CM | POA: Diagnosis not present

## 2018-08-25 DIAGNOSIS — M545 Low back pain: Secondary | ICD-10-CM | POA: Diagnosis not present

## 2018-10-25 DIAGNOSIS — M5117 Intervertebral disc disorders with radiculopathy, lumbosacral region: Secondary | ICD-10-CM | POA: Diagnosis not present

## 2018-10-25 DIAGNOSIS — Z79891 Long term (current) use of opiate analgesic: Secondary | ICD-10-CM | POA: Diagnosis not present

## 2018-10-25 DIAGNOSIS — Z79899 Other long term (current) drug therapy: Secondary | ICD-10-CM | POA: Diagnosis not present

## 2018-10-25 DIAGNOSIS — G894 Chronic pain syndrome: Secondary | ICD-10-CM | POA: Diagnosis not present

## 2018-10-25 DIAGNOSIS — M545 Low back pain: Secondary | ICD-10-CM | POA: Diagnosis not present

## 2018-11-25 DIAGNOSIS — Z79899 Other long term (current) drug therapy: Secondary | ICD-10-CM | POA: Diagnosis not present

## 2018-11-25 DIAGNOSIS — G894 Chronic pain syndrome: Secondary | ICD-10-CM | POA: Diagnosis not present

## 2018-11-25 DIAGNOSIS — Z20828 Contact with and (suspected) exposure to other viral communicable diseases: Secondary | ICD-10-CM | POA: Diagnosis not present

## 2018-11-25 DIAGNOSIS — M545 Low back pain: Secondary | ICD-10-CM | POA: Diagnosis not present

## 2018-11-25 DIAGNOSIS — I10 Essential (primary) hypertension: Secondary | ICD-10-CM | POA: Diagnosis not present

## 2018-11-25 DIAGNOSIS — Z7182 Exercise counseling: Secondary | ICD-10-CM | POA: Diagnosis not present

## 2018-11-25 DIAGNOSIS — Z6841 Body Mass Index (BMI) 40.0 and over, adult: Secondary | ICD-10-CM | POA: Diagnosis not present

## 2018-11-25 DIAGNOSIS — Z79891 Long term (current) use of opiate analgesic: Secondary | ICD-10-CM | POA: Diagnosis not present

## 2018-11-25 DIAGNOSIS — Z713 Dietary counseling and surveillance: Secondary | ICD-10-CM | POA: Diagnosis not present

## 2018-12-02 DIAGNOSIS — I10 Essential (primary) hypertension: Secondary | ICD-10-CM | POA: Diagnosis not present

## 2018-12-02 DIAGNOSIS — E1121 Type 2 diabetes mellitus with diabetic nephropathy: Secondary | ICD-10-CM | POA: Diagnosis not present

## 2018-12-23 DIAGNOSIS — M545 Low back pain: Secondary | ICD-10-CM | POA: Diagnosis not present

## 2018-12-23 DIAGNOSIS — I10 Essential (primary) hypertension: Secondary | ICD-10-CM | POA: Diagnosis not present

## 2018-12-23 DIAGNOSIS — Z6841 Body Mass Index (BMI) 40.0 and over, adult: Secondary | ICD-10-CM | POA: Diagnosis not present

## 2018-12-23 DIAGNOSIS — Z7182 Exercise counseling: Secondary | ICD-10-CM | POA: Diagnosis not present

## 2018-12-23 DIAGNOSIS — Z79891 Long term (current) use of opiate analgesic: Secondary | ICD-10-CM | POA: Diagnosis not present

## 2018-12-23 DIAGNOSIS — Z79899 Other long term (current) drug therapy: Secondary | ICD-10-CM | POA: Diagnosis not present

## 2018-12-23 DIAGNOSIS — Z713 Dietary counseling and surveillance: Secondary | ICD-10-CM | POA: Diagnosis not present

## 2019-01-23 DIAGNOSIS — M545 Low back pain: Secondary | ICD-10-CM | POA: Diagnosis not present

## 2019-01-23 DIAGNOSIS — Z79899 Other long term (current) drug therapy: Secondary | ICD-10-CM | POA: Diagnosis not present

## 2019-01-23 DIAGNOSIS — G894 Chronic pain syndrome: Secondary | ICD-10-CM | POA: Diagnosis not present

## 2019-03-16 ENCOUNTER — Ambulatory Visit (INDEPENDENT_AMBULATORY_CARE_PROVIDER_SITE_OTHER): Payer: Medicare HMO | Admitting: Podiatry

## 2019-03-16 ENCOUNTER — Encounter: Payer: Self-pay | Admitting: Podiatry

## 2019-03-16 ENCOUNTER — Other Ambulatory Visit: Payer: Self-pay

## 2019-03-16 ENCOUNTER — Ambulatory Visit (INDEPENDENT_AMBULATORY_CARE_PROVIDER_SITE_OTHER): Payer: Medicare HMO

## 2019-03-16 VITALS — BP 147/72 | HR 58

## 2019-03-16 DIAGNOSIS — S93492A Sprain of other ligament of left ankle, initial encounter: Secondary | ICD-10-CM

## 2019-03-16 DIAGNOSIS — M25372 Other instability, left ankle: Secondary | ICD-10-CM

## 2019-03-16 DIAGNOSIS — M79672 Pain in left foot: Secondary | ICD-10-CM

## 2019-03-16 DIAGNOSIS — M19072 Primary osteoarthritis, left ankle and foot: Secondary | ICD-10-CM | POA: Diagnosis not present

## 2019-03-16 DIAGNOSIS — S93602A Unspecified sprain of left foot, initial encounter: Secondary | ICD-10-CM | POA: Diagnosis not present

## 2019-03-16 DIAGNOSIS — M19079 Primary osteoarthritis, unspecified ankle and foot: Secondary | ICD-10-CM

## 2019-04-06 ENCOUNTER — Ambulatory Visit: Payer: Medicare HMO | Admitting: Podiatry

## 2019-04-06 ENCOUNTER — Other Ambulatory Visit: Payer: Self-pay

## 2019-04-06 DIAGNOSIS — M25372 Other instability, left ankle: Secondary | ICD-10-CM | POA: Diagnosis not present

## 2019-04-06 DIAGNOSIS — I509 Heart failure, unspecified: Secondary | ICD-10-CM | POA: Diagnosis not present

## 2019-04-06 DIAGNOSIS — M19079 Primary osteoarthritis, unspecified ankle and foot: Secondary | ICD-10-CM

## 2019-04-06 DIAGNOSIS — E119 Type 2 diabetes mellitus without complications: Secondary | ICD-10-CM | POA: Diagnosis not present

## 2019-04-12 NOTE — Progress Notes (Signed)
Subjective:  Patient ID: Trevor Jackson, male    DOB: Aug 17, 1960,  MRN: [id number]  Chief Complaint  Patient presents with  . Foot Pain    left foot, ankle painful since 3 weeks ago    58 y.o. male presents with the above complaint.  States that he was walking stepped a certain way and had a lot of pain in the outside of his ankle.  States that the area started to gradually swell.  Describes are achy sharp pain.  Has been given indomethacin twice does not help with the pain.  States that certain positions make the pain worse.   Review of Systems: Negative except as noted in the HPI. Denies N/V/F/Ch.  Past Medical History:  Diagnosis Date  . Anxiety   . Anxiety and depression 05/18/2012  . Benign head tremor   . CHF (congestive heart failure) ([place])   . Coronary artery disease 05/18/2012  . Diabetes mellitus   . Headache(784.0) 05/18/2012  . Hyperlipidemia   . Hypertension    dr berry  . Morbid obesity with BMI of 40.0-44.9, adult ([place]) 05/18/2012  . Myocardial infarction ([place]) 01/07/2011  . Obstructive sleep apnea 05/18/2012   On CPAP  . OSA (obstructive sleep apnea)   . 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  furosemide (LASIX) 40 MG tablet, take 1 tablet by mouth once daily, Disp: 30 tablet, Rfl: 4 .  insulin detemir (LEVEMIR) 100 UNIT/ML injection, Inject 40 Units into the skin at bedtime., Disp: , Rfl:  .  losartan (COZAAR) 100 MG tablet, Take 1 tablet (100 mg total) by mouth daily., Disp: 30 tablet, Rfl: 1 .  ACCU-CHEK AVIVA PLUS test strip, , Disp: , Rfl:  .  Accu-Chek Softclix Lancets lancets, , Disp: , Rfl:  .  albuterol (VENTOLIN HFA) 108 (90 BASE) MCG/ACT inhaler, Inhale 2 puffs into the lungs every 6 (six) hours as needed. For shortness of breath, Disp: , Rfl:  .  Alcohol Swabs (B-D SINGLE USE SWABS REGULAR) PADS, , Disp: , Rfl:  .  HYDROcodone-acetaminophen (NORCO) 5-325 MG  tablet, Take 1 tablet by mouth every 6 (six) hours as needed for severe pain., Disp: 15 tablet, Rfl: 0 .  HYDROcodone-acetaminophen (NORCO/VICODIN) 5-325 MG per tablet, Take 1 tablet by mouth every 6 (six) hours as needed for moderate pain or severe pain., Disp: 10 tablet, Rfl: 0 .  indomethacin (INDOCIN) 25 MG capsule, , Disp: , Rfl:  .  Niacin CR 1000 MG TBCR, , Disp: , Rfl:  .  oxyCODONE-acetaminophen (PERCOCET) 7.5-325 MG tablet, TAKE 1 TO 2 TABLETS BY MOUTH 3 TIMES DAILY AS NEEDED, Disp: , Rfl:  .  vitamin C (ASCORBIC ACID) 500 MG tablet, Take 500 mg by mouth daily., Disp: , Rfl:  .  zolpidem (AMBIEN) 10 MG tablet, , Disp: , Rfl:   Social History   Tobacco Use  Smoking Status Never Smoker  Smokeless Tobacco Never Used    Allergies  Allergen Reactions  . Other    Objective:   Vitals:   03/16/19 1117  BP: (!) 147/72  Pulse: (!) 58   There is no height or weight on file to calculate BMI. Constitutional Well developed. Well nourished.  Vascular Dorsalis pedis pulses palpable bilaterally. Posterior tibial pulses palpable bilaterally. Capillary refill normal to all digits.  No cyanosis or clubbing noted. Pedal hair growth normal.  Neurologic Normal speech.  Oriented to person, place, and time. Epicritic sensation to light touch grossly present bilaterally.  Dermatologic Nails well groomed and normal in appearance. No open wounds. No skin lesions.  Orthopedic:  Pedal patient with a left ATFL, dorsal midfoot with prominent osteophytes   Radiographs: No acute fractures dislocations osteophytes noted about the dorsal midfoot with degenerative changes Assessment:   1. Sprain of anterior talofibular ligament of left ankle, initial encounter   2. Ankle instability, left   3. Arthritis, midfoot    Plan:  Patient was evaluated and treated and all questions answered.  Ankle sprain left -Educated etiology -Dispensed Tri-Lock brace  Midfoot arthritis left -Injection  delivered as below  Procedure: Joint Injection Location: Left dorsal TMT's joint Skin Prep: alcohol. Injectate: 0.5 cc 1% lidocaine plain, 0.5 cc dexamethasone phosphate. Disposition: Patient tolerated procedure well. Injection site dressed with a band-aid.   Return in about 3 weeks (around 04/06/2019) for Midfoot arthritis left, ankle sprain left .

## 2019-05-02 ENCOUNTER — Other Ambulatory Visit: Payer: Self-pay | Admitting: Podiatry

## 2019-05-02 DIAGNOSIS — S93602A Unspecified sprain of left foot, initial encounter: Secondary | ICD-10-CM

## 2019-07-24 DIAGNOSIS — I10 Essential (primary) hypertension: Secondary | ICD-10-CM | POA: Diagnosis not present

## 2019-08-08 DIAGNOSIS — H40023 Open angle with borderline findings, high risk, bilateral: Secondary | ICD-10-CM | POA: Diagnosis not present

## 2019-08-08 DIAGNOSIS — H25813 Combined forms of age-related cataract, bilateral: Secondary | ICD-10-CM | POA: Diagnosis not present

## 2019-08-08 DIAGNOSIS — E119 Type 2 diabetes mellitus without complications: Secondary | ICD-10-CM | POA: Diagnosis not present

## 2019-08-21 DIAGNOSIS — M545 Low back pain: Secondary | ICD-10-CM | POA: Diagnosis not present

## 2019-08-21 DIAGNOSIS — G4700 Insomnia, unspecified: Secondary | ICD-10-CM | POA: Diagnosis not present

## 2019-08-21 DIAGNOSIS — I10 Essential (primary) hypertension: Secondary | ICD-10-CM | POA: Diagnosis not present

## 2019-08-31 DIAGNOSIS — H40023 Open angle with borderline findings, high risk, bilateral: Secondary | ICD-10-CM | POA: Diagnosis not present

## 2019-08-31 DIAGNOSIS — H25813 Combined forms of age-related cataract, bilateral: Secondary | ICD-10-CM | POA: Diagnosis not present

## 2019-09-12 NOTE — Progress Notes (Signed)
Subjective:  Patient ID: Trevor Jackson, male    DOB: 1961/03/20,  MRN: [id number]  Chief Complaint  Patient presents with  . Foot Pain    pt is here for a f/u on left foot pain, pt states that he is doing alot better, and is concerned about some swelling in his foot    59 y.o. male presents with the above complaint.  States that he was walking stepped a certain way and had a lot of pain in the outside of his ankle.  States that the area started to gradually swell.  Describes are achy sharp pain.  Has been given indomethacin twice does not help with the pain.  States that certain positions make the pain worse.   Review of Systems: Negative except as noted in the HPI. Denies N/V/F/Ch.  Past Medical History:  Diagnosis Date  . Anxiety   . Anxiety and depression 05/18/2012  . Benign head tremor   . CHF (congestive heart failure) ([place])   . Coronary artery disease 05/18/2012  . Diabetes mellitus   . Headache(784.0) 05/18/2012  . Hyperlipidemia   . Hypertension    dr berry  . Morbid obesity with BMI of 40.0-44.9, adult ([place]) 05/18/2012  . Myocardial infarction ([place]) 01/07/2011  . Obstructive sleep apnea 05/18/2012   On CPAP  . OSA (obstructive sleep apnea)   . 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  ACCU-CHEK AVIVA PLUS test strip, , Disp: , Rfl:  .  Accu-Chek Softclix Lancets lancets, , Disp: , Rfl:  .  albuterol (VENTOLIN HFA) 108 (90 BASE) MCG/ACT inhaler, Inhale 2 puffs into the lungs every 6 (six) hours as needed. For shortness of breath, Disp: , Rfl:  .  Alcohol Swabs (B-D SINGLE USE SWABS REGULAR) PADS, , Disp: , Rfl:  .  furosemide (LASIX) 40 MG tablet, take 1 tablet by mouth once daily, Disp: 30 tablet, Rfl: 4 .  HYDROcodone-acetaminophen (NORCO) 5-325 MG tablet, Take 1 tablet by mouth every 6 (six) hours as needed for severe pain., Disp: 15 tablet, Rfl: 0 .  HYDROcodone-acetaminophen  (NORCO/VICODIN) 5-325 MG per tablet, Take 1 tablet by mouth every 6 (six) hours as needed for moderate pain or severe pain., Disp: 10 tablet, Rfl: 0 .  indomethacin (INDOCIN) 25 MG capsule, , Disp: , Rfl:  .  insulin detemir (LEVEMIR) 100 UNIT/ML injection, Inject 40 Units into the skin at bedtime., Disp: , Rfl:  .  losartan (COZAAR) 100 MG tablet, Take 1 tablet (100 mg total) by mouth daily., Disp: 30 tablet, Rfl: 1 .  Niacin CR 1000 MG TBCR, , Disp: , Rfl:  .  oxyCODONE-acetaminophen (PERCOCET) 7.5-325 MG tablet, TAKE 1 TO 2 TABLETS BY MOUTH 3 TIMES DAILY AS NEEDED, Disp: , Rfl:  .  vitamin C (ASCORBIC ACID) 500 MG tablet, Take 500 mg by mouth daily., Disp: , Rfl:  .  zolpidem (AMBIEN) 10 MG tablet, , Disp: , Rfl:   Social History   Tobacco Use  Smoking Status Never Smoker  Smokeless Tobacco Never Used    Allergies  Allergen Reactions  . Other    Objective:   There were no vitals filed for this visit. There is no height or weight on file to calculate BMI. Constitutional Well developed. Well nourished.  Vascular Dorsalis pedis pulses palpable bilaterally. Posterior tibial pulses palpable bilaterally. Capillary refill normal to all digits.  No  cyanosis or clubbing noted. Pedal hair growth normal.  Neurologic Normal speech. Oriented to person, place, and time. Epicritic sensation to light touch grossly present bilaterally.  Dermatologic Nails well groomed and normal in appearance. No open wounds. No skin lesions.  Orthopedic:  Pedal patient with a left ATFL, dorsal midfoot with prominent osteophytes   Assessment:   1. Ankle instability, left   2. Arthritis, midfoot    Plan:  Patient was evaluated and treated and all questions answered.  Ankle sprain left -Improving.  Transition out of brace as tolerated  Midfoot arthritis left -No repeat injection today  Follow-up should pain persist

## 2019-09-18 DIAGNOSIS — Z0000 Encounter for general adult medical examination without abnormal findings: Secondary | ICD-10-CM | POA: Diagnosis not present

## 2019-10-23 DIAGNOSIS — Z713 Dietary counseling and surveillance: Secondary | ICD-10-CM | POA: Diagnosis not present

## 2019-10-23 DIAGNOSIS — Z6841 Body Mass Index (BMI) 40.0 and over, adult: Secondary | ICD-10-CM | POA: Diagnosis not present

## 2019-10-23 DIAGNOSIS — I10 Essential (primary) hypertension: Secondary | ICD-10-CM | POA: Diagnosis not present

## 2019-10-23 DIAGNOSIS — Z79899 Other long term (current) drug therapy: Secondary | ICD-10-CM | POA: Diagnosis not present

## 2019-10-23 DIAGNOSIS — Z7182 Exercise counseling: Secondary | ICD-10-CM | POA: Diagnosis not present

## 2019-10-23 DIAGNOSIS — M545 Low back pain: Secondary | ICD-10-CM | POA: Diagnosis not present

## 2019-11-23 DIAGNOSIS — G4700 Insomnia, unspecified: Secondary | ICD-10-CM | POA: Diagnosis not present

## 2019-11-23 DIAGNOSIS — Z8249 Family history of ischemic heart disease and other diseases of the circulatory system: Secondary | ICD-10-CM | POA: Diagnosis not present

## 2019-11-23 DIAGNOSIS — M48061 Spinal stenosis, lumbar region without neurogenic claudication: Secondary | ICD-10-CM | POA: Diagnosis not present

## 2019-12-21 DIAGNOSIS — G8929 Other chronic pain: Secondary | ICD-10-CM | POA: Diagnosis not present

## 2019-12-21 DIAGNOSIS — Z79899 Other long term (current) drug therapy: Secondary | ICD-10-CM | POA: Diagnosis not present

## 2020-01-18 DIAGNOSIS — Z79899 Other long term (current) drug therapy: Secondary | ICD-10-CM | POA: Diagnosis not present

## 2020-11-10 ENCOUNTER — Encounter: Payer: Self-pay | Admitting: Emergency Medicine

## 2020-11-10 ENCOUNTER — Other Ambulatory Visit: Payer: Self-pay

## 2020-11-10 ENCOUNTER — Ambulatory Visit
Admission: EM | Admit: 2020-11-10 | Discharge: 2020-11-10 | Disposition: A | Discharge status: Home / Self Care | Payer: Medicare HMO | Attending: Emergency Medicine | Admitting: Emergency Medicine

## 2020-11-10 DIAGNOSIS — H8112 Benign paroxysmal vertigo, left ear: Secondary | ICD-10-CM | POA: Diagnosis not present

## 2020-11-10 DIAGNOSIS — R42 Dizziness and giddiness: Secondary | ICD-10-CM | POA: Diagnosis not present

## 2020-11-10 MED ORDER — FLUTICASONE PROPIONATE 50 MCG/ACT NA SUSP: 1.0000 | Freq: Every day | NASAL | 0 refills | Status: AC

## 2020-11-10 MED ORDER — MECLIZINE HCL 12.5 MG PO TABS: 12.5000 mg | ORAL_TABLET | Freq: Three times a day (TID) | ORAL | 0 refills | Status: AC | PRN

## 2020-11-10 MED ORDER — LORATADINE 10 MG PO TABS: 10.0000 mg | ORAL_TABLET | Freq: Every day | ORAL | 0 refills | Status: DC

## 2020-11-10 MED ORDER — AZELASTINE HCL 0.1 % NA SOLN: 1.0000 | Freq: Two times a day (BID) | NASAL | 0 refills | Status: AC

## 2020-11-10 NOTE — ED Triage Notes (Signed)
Pt here for left ear fullness upon waking this am then noticed dizziness worse with head turn and some nausea; pt sts episode of gen weakness; pt seen by EMS had normal screenings and suggested be eval for vertigo

## 2020-11-10 NOTE — Discharge Instructions (Addendum)
Begin daily Claritin, Flonase and Astelin nasal spray to help with fluid on ears and underlying sinus inflammation contributing to symptoms Meclizine 3 times daily as needed for dizziness and nausea Drink plenty of fluids May try Epley maneuver, logroll/BBQ maneuver at home to further help with vertigo-please search videos on YouTube  Please go to emergency room if developing any headache, vision changes, difficulty speaking, one-sided weakness, numbness/tingling, chest pain, persistent vomiting

## 2020-11-10 NOTE — ED Provider Notes (Signed)
[name]    CSN: [id number] Arrival date & time: 11/10/20  1231      History   Chief Complaint Chief Complaint  Patient presents with  . Dizziness    HPI Trevor Jackson is a 60 y.o. male history of CAD, prior CABG, hypertension, CHF, DM type II presenting today for evaluation of dizziness.  Patient reports that upon wakening this morning he had left ear fullness and felt muffled/popped.  While he was driving he turned his head and felt a sensation of spinning which then led to nausea and vomiting.  Symptoms persisted, EMS was called and screened with EKG which was reported normal, blood sugar around 150.  He denies any chest pain or shortness of breath associated with episodes.  Denies headaches, vision changes, one-sided weakness, numbness tingling, difficulty speaking.  Denies history of similar.  HPI  Past Medical History:  Diagnosis Date  . Anxiety   . Anxiety and depression 05/18/2012  . Benign head tremor   . CHF (congestive heart failure) (HCC)   . Coronary artery disease 05/18/2012  . Diabetes mellitus   . Headache(784.0) 05/18/2012  . Hyperlipidemia   . Hypertension    dr berry  . Morbid obesity with BMI of 40.0-44.9, adult (HCC) 05/18/2012  . Myocardial infarction (HCC) 01/07/2011  . Obstructive sleep apnea 05/18/2012   On CPAP  . OSA (obstructive sleep apnea)   . 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 Acute right-sided low back pain without sciatica 06/11/2017  . Acute lumbar myofascial strain 06/11/2017  . 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 Coronary atherosclerosis of native coronary artery 06/06/2012  . Coronary artery disease 05/18/2012  . CAD (coronary artery disease) 05/18/2012  . Morbid obesity with BMI of 40.0-44.9, adult (HCC) 05/18/2012  . Hypertension 05/18/2012  . Hyperlipidemia 05/18/2012  . Headache(784.0) 05/18/2012  . Anxiety and  depression 05/18/2012  . Obstructive sleep apnea 05/18/2012  . Multiple pulmonary nodules 02/25/2012  . Cough 02/18/2012  . Dyspnea 02/18/2012    Past Surgical History:  Procedure Laterality Date  . CORONARY ANGIOPLASTY WITH STENT PLACEMENT  01-07-11  . CORONARY ARTERY BYPASS GRAFT  06/08/2012   Procedure: CORONARY ARTERY BYPASS GRAFTING (CABG);  Surgeon: Purcell Nails, MD;  Location: [facility] OR;  Service: Open Heart Surgery;  Laterality: N/A;  . kidney stone removal    . LEFT HEART CATHETERIZATION WITH CORONARY ANGIOGRAM N/A 05/13/2012   Procedure: LEFT HEART CATHETERIZATION WITH CORONARY ANGIOGRAM;  Surgeon: Marykay Lex, MD;  Location: [facility] CATH LAB;  Service: Cardiovascular;  Laterality: N/A;  . RADIAL ARTERY HARVEST  06/08/2012   Procedure: RADIAL ARTERY HARVEST;  Surgeon: Purcell Nails, MD;  Location: [facility] OR;  Service: Open Heart Surgery;  Laterality: Left;  . SHOULDER SURGERY  2008       Home Medications    Prior to Admission medications   Medication Sig Start Date End Date Taking? Authorizing Provider  azelastine (ASTELIN) 0.1 % nasal spray Place 1 spray into both nostrils 2 (two) times daily. Use in each nostril as directed 11/10/20  Yes Tyera Hansley C, PA-C  fluticasone (FLONASE) 50 MCG/ACT nasal spray Place 1-2 sprays into both nostrils daily. 11/10/20  Yes Ziyad Dyar C, PA-C  loratadine (CLARITIN) 10 MG tablet Take 1 tablet (10 mg total) by mouth daily. 11/10/20  Yes Sharyon Cable, [employer]  C, PA-C  meclizine (ANTIVERT) 12.5 MG tablet Take 1-2 tablets (12.5-25 mg total) by mouth 3 (three) times daily as needed for dizziness or nausea. 11/10/20  Yes Aniesa Boback, [place] C, PA-C  ACCU-CHEK AVIVA PLUS test strip  03/16/19   [provider]  Accu-Chek Softclix Lancets lancets  03/16/19   [provider]  albuterol (VENTOLIN HFA) 108 (90 BASE) MCG/ACT inhaler Inhale 2 puffs into the lungs every 6 (six) hours as needed. For shortness of breath    [provider]   Alcohol Swabs (B-D SINGLE USE SWABS REGULAR) PADS  03/16/19   [provider]  furosemide (LASIX) 40 MG tablet take 1 tablet by mouth once daily 08/01/14   Croitoru, Mihai, MD  HYDROcodone-acetaminophen (NORCO) 5-325 MG tablet Take 1 tablet by mouth every 6 (six) hours as needed for severe pain. Patient not taking: Reported on 11/10/2020 06/11/17   Georgina Quint, MD  HYDROcodone-acetaminophen (NORCO/VICODIN) 5-325 MG per tablet Take 1 tablet by mouth every 6 (six) hours as needed for moderate pain or severe pain. 08/10/14   Linna Hoff, MD  indomethacin (INDOCIN) 25 MG capsule  03/13/19   [provider]  insulin detemir (LEVEMIR) 100 UNIT/ML injection Inject 40 Units into the skin at bedtime.    [provider]  losartan (COZAAR) 100 MG tablet Take 1 tablet (100 mg total) by mouth daily. 06/14/12   Wilmon Pali, PA-C  Niacin CR 1000 MG TBCR  10/28/13   [provider]  oxyCODONE-acetaminophen (PERCOCET) 7.5-325 MG tablet TAKE 1 TO 2 TABLETS BY MOUTH 3 TIMES DAILY AS NEEDED 03/25/19   [provider]  vitamin C (ASCORBIC ACID) 500 MG tablet Take 500 mg by mouth daily.    [provider]  zolpidem (AMBIEN) 10 MG tablet  03/19/19   [provider]    Family History Family History  Problem Relation Age of Onset  . Heart disease Mother   . Hyperlipidemia Mother   . Hypertension Mother   . Heart attack Mother   . Heart disease Sister   . Bone cancer Father   . Heart attack Father   . Diabetes Sister   . Hyperlipidemia Sister   . Hypertension Sister   . Prostate cancer Brother   . Heart attack Brother   . Hyperlipidemia Brother   . Hypertension Brother     Social History Social History   Tobacco Use  . Smoking status: Never Smoker  . Smokeless tobacco: Never Used  Substance Use Topics  . Alcohol use: No  . Drug use: No     Allergies   Other   Review of Systems Review of Systems  Constitutional: Negative  for fatigue and fever.  HENT: Negative for congestion, sinus pressure and sore throat.   Eyes: Negative for photophobia, pain and visual disturbance.  Respiratory: Negative for cough and shortness of breath.   Cardiovascular: Negative for chest pain.  Gastrointestinal: Positive for nausea and vomiting. Negative for abdominal pain.  Genitourinary: Negative for decreased urine volume and hematuria.  Musculoskeletal: Negative for myalgias, neck pain and neck stiffness.  Neurological: Positive for dizziness. Negative for syncope, facial asymmetry, speech difficulty, weakness, light-headedness, numbness and headaches.     Physical Exam Triage Vital Signs ED Triage Vitals [11/10/20 1239]  Enc Vitals Group     BP (!) 156/83     Pulse Rate 67     Resp 18     Temp 97.9 F (36.6 C)  Temp Source Oral     SpO2 95 %     Weight      Height      Head Circumference      Peak Flow      Pain Score 0     Pain Loc      Pain Edu?      Excl. in GC?    No data found.  Updated Vital Signs BP (!) 156/83 (BP Location: Left Arm)   Pulse 67   Temp 97.9 F (36.6 C) (Oral)   Resp 18   SpO2 95%   Visual Acuity Right Eye Distance:   Left Eye Distance:   Bilateral Distance:    Right Eye Near:   Left Eye Near:    Bilateral Near:     Physical Exam Vitals and nursing note reviewed.  Constitutional:      Appearance: He is well-developed.     Comments: No acute distress  HENT:     Head: Normocephalic and atraumatic.     Ears:     Comments: Bilateral ears without tenderness to palpation of external auricle, tragus and mastoid, EAC's without erythema or swelling, TM's with good bony landmarks and cone of light. Non erythematous.     Nose: Nose normal.     Mouth/Throat:     Comments: Oral mucosa pink and moist, no tonsillar enlargement or exudate. Posterior pharynx patent and nonerythematous, no uvula deviation or swelling. Normal phonation. Eyes:     Conjunctiva/sclera: Conjunctivae  normal.  Cardiovascular:     Rate and Rhythm: Normal rate.  Pulmonary:     Effort: Pulmonary effort is normal. No respiratory distress.     Comments: Breathing comfortably at rest, CTABL, no wheezing, rales or other adventitious sounds auscultated Abdominal:     General: There is no distension.  Musculoskeletal:        General: Normal range of motion.     Cervical back: Neck supple.  Skin:    General: Skin is warm and dry.  Neurological:     General: No focal deficit present.     Mental Status: He is alert and oriented to person, place, and time. Mental status is at baseline.     Comments: Patient A&O x3, cranial nerves II-XII grossly intact, strength at shoulders, hips and knees 5/5, equal bilaterally, Negative Romberg and Pronator Drift. Gait without abnormality.      UC Treatments / Results  Labs (all labs ordered are listed, but only abnormal results are displayed) Labs Reviewed - No data to display  EKG   Radiology No results found.  Procedures Procedures (including critical care time)  Medications Ordered in UC Medications - No data to display  Initial Impression / Assessment and Plan / UC Course  I have reviewed the triage vital signs and the nursing notes.  Pertinent labs & imaging results that were available during my care of the patient were reviewed by me and considered in my medical decision making (see chart for details).     EKG normal sinus rhythm, no acute signs of ischemia infarction, no neurodeficits noted on exam, symptoms suggestive of BPPV, do not suspect central cause at this time.  Treating with meclizine, likely secondary to underlying eustachian tube dysfunction/allergic rhinitis.  Initiating on Claritin, Astelin and Flonase.  Deferring oral steroids given medical history/diabetes.  Discussed strict return precautions. Patient verbalized understanding and is agreeable with plan.  Final Clinical Impressions(s) / UC Diagnoses   Final  diagnoses:  Dizziness  Benign  paroxysmal positional vertigo of left ear     Discharge Instructions     Begin daily Claritin, Flonase and Astelin nasal spray to help with fluid on ears and underlying sinus inflammation contributing to symptoms Meclizine 3 times daily as needed for dizziness and nausea Drink plenty of fluids May try Epley maneuver, logroll/BBQ maneuver at home to further help with vertigo-please search videos on YouTube  Please go to emergency room if developing any headache, vision changes, difficulty speaking, one-sided weakness, numbness/tingling, chest pain, persistent vomiting      ED Prescriptions    Medication Sig Dispense Auth. Provider   loratadine (CLARITIN) 10 MG tablet Take 1 tablet (10 mg total) by mouth daily. 15 tablet Marylynne Keelin C, PA-C   fluticasone (FLONASE) 50 MCG/ACT nasal spray Place 1-2 sprays into both nostrils daily. 16 g Amani Marseille C, PA-C   azelastine (ASTELIN) 0.1 % nasal spray Place 1 spray into both nostrils 2 (two) times daily. Use in each nostril as directed 30 mL Faren Florence C, PA-C   meclizine (ANTIVERT) 12.5 MG tablet Take 1-2 tablets (12.5-25 mg total) by mouth 3 (three) times daily as needed for dizziness or nausea. 30 tablet Urvi Imes, [place] C, PA-C     PDMP not reviewed this encounter.   Lew Dawes, PA-C 11/10/20 1443

## 2022-05-07 ENCOUNTER — Ambulatory Visit (INDEPENDENT_AMBULATORY_CARE_PROVIDER_SITE_OTHER): Payer: No Typology Code available for payment source

## 2022-05-07 ENCOUNTER — Encounter: Payer: Self-pay | Admitting: Emergency Medicine

## 2022-05-07 ENCOUNTER — Other Ambulatory Visit: Payer: Self-pay

## 2022-05-07 ENCOUNTER — Ambulatory Visit
Admission: EM | Admit: 2022-05-07 | Discharge: 2022-05-07 | Disposition: A | Discharge status: Home / Self Care | Payer: No Typology Code available for payment source | Attending: Internal Medicine | Admitting: Internal Medicine

## 2022-05-07 DIAGNOSIS — J069 Acute upper respiratory infection, unspecified: Secondary | ICD-10-CM | POA: Insufficient documentation

## 2022-05-07 DIAGNOSIS — R059 Cough, unspecified: Secondary | ICD-10-CM

## 2022-05-07 DIAGNOSIS — R058 Other specified cough: Secondary | ICD-10-CM | POA: Diagnosis not present

## 2022-05-07 DIAGNOSIS — Z1152 Encounter for screening for COVID-19: Secondary | ICD-10-CM | POA: Insufficient documentation

## 2022-05-07 DIAGNOSIS — Z7951 Long term (current) use of inhaled steroids: Secondary | ICD-10-CM | POA: Diagnosis not present

## 2022-05-07 DIAGNOSIS — R509 Fever, unspecified: Secondary | ICD-10-CM

## 2022-05-07 MED ORDER — BENZONATATE 100 MG PO CAPS: 100.0000 mg | ORAL_CAPSULE | Freq: Three times a day (TID) | ORAL | 0 refills | Status: DC | PRN

## 2022-05-07 MED ORDER — ALBUTEROL SULFATE (2.5 MG/3ML) 0.083% IN NEBU
2.5000 mg | INHALATION_SOLUTION | Freq: Once | RESPIRATORY_TRACT | Status: AC
  Administered 2022-05-07: 2.5 mg via RESPIRATORY_TRACT
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
## 2022-05-07 NOTE — Discharge Instructions (Signed)
You have a viral illness which is should run its course and self resolve with symptomatic treatment.  I have prescribed you a cough medication.  You may use Flonase as well.  Follow-up if symptoms persist or worsen.

## 2022-05-07 NOTE — ED Triage Notes (Signed)
Pt here for cough and congestion with fever and body aches x 4 days

## 2022-05-08 LAB — SARS CORONAVIRUS 2 (TAT 6-24 HRS): SARS Coronavirus 2: NEGATIVE

## 2023-04-08 ENCOUNTER — Emergency Department (HOSPITAL_COMMUNITY)
Admission: EM | Admit: 2023-04-08 | Discharge: 2023-04-08 | Disposition: A | Discharge status: Home / Self Care | Payer: No Typology Code available for payment source | Attending: Emergency Medicine | Admitting: Emergency Medicine

## 2023-04-08 ENCOUNTER — Encounter (HOSPITAL_COMMUNITY): Payer: Self-pay

## 2023-04-08 ENCOUNTER — Emergency Department (HOSPITAL_COMMUNITY): Payer: No Typology Code available for payment source

## 2023-04-08 DIAGNOSIS — Z794 Long term (current) use of insulin: Secondary | ICD-10-CM | POA: Insufficient documentation

## 2023-04-08 DIAGNOSIS — E119 Type 2 diabetes mellitus without complications: Secondary | ICD-10-CM | POA: Diagnosis not present

## 2023-04-08 DIAGNOSIS — Z951 Presence of aortocoronary bypass graft: Secondary | ICD-10-CM | POA: Insufficient documentation

## 2023-04-08 DIAGNOSIS — I2510 Atherosclerotic heart disease of native coronary artery without angina pectoris: Secondary | ICD-10-CM | POA: Diagnosis not present

## 2023-04-08 DIAGNOSIS — R109 Unspecified abdominal pain: Secondary | ICD-10-CM | POA: Insufficient documentation

## 2023-04-08 DIAGNOSIS — I509 Heart failure, unspecified: Secondary | ICD-10-CM | POA: Diagnosis not present

## 2023-04-08 DIAGNOSIS — I110 Hypertensive heart disease with heart failure: Secondary | ICD-10-CM | POA: Diagnosis not present

## 2023-04-08 LAB — BASIC METABOLIC PANEL
Anion gap: 8 (ref 5–15)
BUN: 17 mg/dL (ref 8–23)
CO2: 28 mmol/L (ref 22–32)
Calcium: 9.2 mg/dL (ref 8.9–10.3)
Chloride: 103 mmol/L (ref 98–111)
Creatinine, Ser: 1.01 mg/dL (ref 0.61–1.24)
GFR, Estimated: >60 mL/min (ref >60)
Glucose, Bld: 106 mg/dL — ABNORMAL HIGH (ref 70–99)
Potassium: 4 mmol/L (ref 3.5–5.1)
Sodium: 139 mmol/L (ref 135–145)

## 2023-04-08 LAB — CBC
HCT: 43.2 % (ref 39.0–52.0)
Hemoglobin: 13.6 g/dL (ref 13.0–17.0)
MCH: 28.1 pg (ref 26.0–34.0)
MCHC: 31.5 g/dL (ref 30.0–36.0)
MCV: 89.3 fL (ref 80.0–100.0)
Platelets: 256 10*3/uL (ref 150–400)
RBC: 4.84 MIL/uL (ref 4.22–5.81)
RDW: 16.3 % — ABNORMAL HIGH (ref 11.5–15.5)
WBC: 7.7 10*3/uL (ref 4.0–10.5)
nRBC: 0 % (ref 0.0–0.2)

## 2023-04-08 LAB — CBG MONITORING, ED: Glucose-Capillary: 56 mg/dL — ABNORMAL LOW (ref 70–99)

## 2023-04-08 LAB — URINALYSIS, ROUTINE W REFLEX MICROSCOPIC
Bacteria, UA: NONE SEEN
Bilirubin Urine: NEGATIVE
Glucose, UA: >=500 mg/dL — AB
Hgb urine dipstick: NEGATIVE
Ketones, ur: NEGATIVE mg/dL
Leukocytes,Ua: NEGATIVE
Nitrite: NEGATIVE
Protein, ur: NEGATIVE mg/dL
Specific Gravity, Urine: 1.01 (ref 1.005–1.030)
pH: 5 (ref 5.0–8.0)

## 2023-04-08 MED ORDER — LIDOCAINE 5 % EX PTCH: 1.0000 | MEDICATED_PATCH | CUTANEOUS | 0 refills | Status: AC

## 2023-04-08 NOTE — Discharge Instructions (Signed)
As we discussed, your ER today was reassuring for acute findings.  Laboratory evaluation and CT imaging did not reveal any emergent cause of your symptoms.  It did show an increase in the amount of stool in your intestinal tract which could be due to constipation.  I recommend trying MiraLAX for the next few days to improve this and see if that improves your pain. I have given you a prescription for lidocaine patches as these are helping your symptoms.  Please use them as prescribed.  May also take Tylenol/ibuprofen as needed and try a heating pad over the area that is hurting.  Please call your primary doctor to schedule follow-up appointment at your earliest convenience.  Return if development of any new or worsening symptoms.

## 2023-04-08 NOTE — ED Triage Notes (Signed)
POV c/o right flank pain x 1 week, Hx kidney stones, similar pain. Lidocaine patch applied at home and helps pain. Denies dysuria

## 2023-04-08 NOTE — ED Notes (Signed)
NT advised pt refusing vitals, stating he is ready to leave, he is about to walk out.

## 2023-04-08 NOTE — ED Notes (Signed)
Pt given a sandwich and sprite- OK per provider

## 2023-04-08 NOTE — ED Provider Notes (Signed)
[place] EMERGENCY DEPARTMENT AT [facility] Provider Note   CSN: [id number] Arrival date & time: 04/08/23  1239     History  Chief Complaint  Patient presents with   Flank Pain    Roberts Bon is a 62 y.o. male.  Patient with history of hypertension, CAD, hyperlipidemia, CABG x 3, OSA presents today with complaints of right flank pain. He states that same began 1 week ago and has been persistent since onset. He states that he has had several kidney stones previously and this feels similar. He notes that the only thing that improves his pain is lidocaine patches which he has been using consistently since symptom onset. He denies any middle back pain or pain radiating down his legs. No numbness/tingling in his legs. He is able to walk without issue. Denies fevers, chills, nausea, vomiting, or diarrhea.  No hematuria or dysuria.  No chest pain or shortness of breath.  Denies any recent trauma or overuse.  The history is provided by the patient. No language interpreter was used.  Flank Pain       Home Medications Prior to Admission medications   Medication Sig Start Date End Date Taking? Authorizing Provider  ACCU-CHEK AVIVA PLUS test strip  03/16/19   [provider]  Accu-Chek Softclix Lancets lancets  03/16/19   [provider]  albuterol (VENTOLIN HFA) 108 (90 BASE) MCG/ACT inhaler Inhale 2 puffs into the lungs every 6 (six) hours as needed. For shortness of breath    [provider]  Alcohol Swabs (B-D SINGLE USE SWABS REGULAR) PADS  03/16/19   [provider]  azelastine (ASTELIN) 0.1 % nasal spray Place 1 spray into both nostrils 2 (two) times daily. Use in each nostril as directed 11/10/20   Wieters, Hallie C, PA-C  benzonatate (TESSALON) 100 MG capsule Take 1 capsule (100 mg total) by mouth every 8 (eight) hours as needed for cough. 05/07/22   Gustavus Bryant, FNP  fluticasone (FLONASE) 50 MCG/ACT nasal spray Place 1-2 sprays  into both nostrils daily. 11/10/20   Wieters, Hallie C, PA-C  furosemide (LASIX) 40 MG tablet take 1 tablet by mouth once daily 08/01/14   Croitoru, Mihai, MD  HYDROcodone-acetaminophen (NORCO) 5-325 MG tablet Take 1 tablet by mouth every 6 (six) hours as needed for severe pain. Patient not taking: Reported on 11/10/2020 06/11/17   Georgina Quint, MD  HYDROcodone-acetaminophen (NORCO/VICODIN) 5-325 MG per tablet Take 1 tablet by mouth every 6 (six) hours as needed for moderate pain or severe pain. 08/10/14   Linna Hoff, MD  indomethacin (INDOCIN) 25 MG capsule  03/13/19   [provider]  insulin detemir (LEVEMIR) 100 UNIT/ML injection Inject 40 Units into the skin at bedtime.    [provider]  loratadine (CLARITIN) 10 MG tablet Take 1 tablet (10 mg total) by mouth daily. 11/10/20   Wieters, Hallie C, PA-C  losartan (COZAAR) 100 MG tablet Take 1 tablet (100 mg total) by mouth daily. 06/14/12   Collins, Debby Freiberg, PA-C  meclizine (ANTIVERT) 12.5 MG tablet Take 1-2 tablets (12.5-25 mg total) by mouth 3 (three) times daily as needed for dizziness or nausea. 11/10/20   Wieters, Hallie C, PA-C  Niacin CR 1000 MG TBCR  10/28/13   [provider]  oxyCODONE-acetaminophen (PERCOCET) 7.5-325 MG tablet TAKE 1 TO 2 TABLETS BY MOUTH 3 TIMES DAILY AS NEEDED 03/25/19   [provider]  vitamin C (ASCORBIC ACID) 500 MG tablet Take 500 mg  by mouth daily.    [provider]  zolpidem (AMBIEN) 10 MG tablet  03/19/19   [provider]      Allergies    Other    Review of Systems   Review of Systems  Genitourinary:  Positive for flank pain.  All other systems reviewed and are negative.   Physical Exam Updated Vital Signs BP 117/68 (BP Location: Right Arm)   Pulse (!) 55   Temp 97.8 F (36.6 C) (Oral)   Resp 17   SpO2 94%  Physical Exam Vitals and nursing note reviewed.  Constitutional:      General: He is not in acute distress.    Appearance:  Normal appearance. He is normal weight. He is not ill-appearing, toxic-appearing or diaphoretic.  HENT:     Head: Normocephalic and atraumatic.  Cardiovascular:     Rate and Rhythm: Normal rate and regular rhythm.     Pulses:          Dorsalis pedis pulses are 2+ on the right side and 2+ on the left side.       Posterior tibial pulses are 2+ on the right side and 2+ on the left side.     Heart sounds: Normal heart sounds. No murmur heard. Pulmonary:     Effort: Pulmonary effort is normal. No respiratory distress.     Breath sounds: Normal breath sounds.  Abdominal:     General: Abdomen is flat.     Palpations: Abdomen is soft.     Tenderness: There is no abdominal tenderness.     Comments: Mild tenderness to palpation of the right side/flank area. Lidocaine patch in place. No overlying skin changes, rashes, or lesions.   Musculoskeletal:        General: Normal range of motion.     Cervical back: Normal range of motion.     Right lower leg: No edema.     Left lower leg: No edema.     Comments: No midline tenderness to palpation of the cervical, thoracic, or lumbar spine. 5/5 strength and sensation intact in bilateral lower extremities.  Patient observed to be ambulatory with steady gait.  Skin:    General: Skin is warm and dry.  Neurological:     General: No focal deficit present.     Mental Status: He is alert.  Psychiatric:        Mood and Affect: Mood normal.        Behavior: Behavior normal.     ED Results / Procedures / Treatments   Labs (all labs ordered are listed, but only abnormal results are displayed) Labs Reviewed  URINALYSIS, ROUTINE W REFLEX MICROSCOPIC - Abnormal; Notable for the following components:      Result Value   Color, Urine STRAW (*)    Glucose, UA >=500 (*)    All other components within normal limits  BASIC METABOLIC PANEL - Abnormal; Notable for the following components:   Glucose, Bld 106 (*)    All other components within normal limits  CBC -  Abnormal; Notable for the following components:   RDW 16.3 (*)    All other components within normal limits    EKG None  Radiology CT Renal Stone Study  Result Date: 04/08/2023 CLINICAL DATA:  Flank pain for 1 week, kidney stones had similar pain. EXAM: CT ABDOMEN AND PELVIS WITHOUT CONTRAST TECHNIQUE: Multidetector CT imaging of the abdomen and pelvis was performed following the standard protocol without IV contrast. RADIATION DOSE REDUCTION:  This exam was performed according to the departmental dose-optimization program which includes automated exposure control, adjustment of the mA and/or kV according to patient size and/or use of iterative reconstruction technique. COMPARISON:  CT abdomen and pelvis 11/30/2005 FINDINGS: Lower chest: No acute abnormality. Eventration of both hemidiaphragms Hepatobiliary: Cholelithiasis without evidence of cholecystitis. Unremarkable liver and biliary tree. Pancreas: Unremarkable. Spleen: Unremarkable. Adrenals/Urinary Tract: Normal adrenal glands. No urinary calculi or hydronephrosis. Unremarkable bladder. Stomach/Bowel: No obstruction or bowel wall thickening. Moderate colonic stool load. Normal appendix and stomach. Vascular/Lymphatic: Aortic atherosclerosis. No enlarged abdominal or pelvic lymph nodes. Reproductive: Unremarkable. Other: No free intraperitoneal fluid or air. Musculoskeletal: No acute fracture. Ankylosis of the left SI joint. Metallic density adjacent to the right side of T12. IMPRESSION: 1. No acute abnormality in the abdomen or pelvis. 2. Cholelithiasis without evidence of cholecystitis. 3. Moderate colonic stool load. Aortic Atherosclerosis (ICD10-I70.0). Electronically Signed   By: Minerva Fester M.D.   On: 04/08/2023 19:19    Procedures Procedures    Medications Ordered in ED Medications - No data to display  ED Course/ Medical Decision Making/ A&P                                 Medical Decision Making Amount and/or Complexity of  Data Reviewed Labs: ordered. Radiology: ordered.   This patient is a 62 y.o. male who presents to the ED for concern of right flank pain, this involves an extensive number of treatment options, and is a complaint that carries with it a high risk of complications and morbidity. The emergent differential diagnosis prior to evaluation includes, but is not limited to,  MSK pain, AAA, renal artery/vein embolism/thrombosis, mesenteric ischemia, pyelonephritis, nephrolithiasis, cystitis, biliary colic, pancreatitis, perforated peptic ulcer, appendicitis, diverticulitis, bowel obstruction, Testicular torsion, Epididymitis   This is not an exhaustive differential.   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  Additional history: Chart reviewed.  Physical Exam: Physical exam performed. The pertinent findings include: Mild TTP of right side/flank area with no rebound or guarding. No rashes or overlying skin changes. Good distal pulses and sensation. Good ROM.  Lab Tests: I ordered, and personally interpreted labs.  The pertinent results include: No acute laboratory abnormalities   Imaging Studies: I ordered imaging studies including CT renal . I independently visualized and interpreted imaging which showed   1. No acute abnormality in the abdomen or pelvis. 2. Cholelithiasis without evidence of cholecystitis. 3. Moderate colonic stool load.  I agree with the radiologist interpretation.  Medications: Offered patient pain medication  which he refused. Given juice for blood sugar 53 with improvement   Disposition: After consideration of the diagnostic results and the patients response to treatment, I feel that emergency department workup does not suggest an emergent condition requiring admission or immediate intervention beyond what has been performed at this time. The plan is: Discharge with close outpatient follow-up and return precautions.  His CT is overall benign.  His pain is controlled with the lidocaine patch which suggest muscular cause of symptoms.  He has no signs or symptoms to suggest cauda equina.  He has good distal pulses and pain is not consistent with aortic dissection.  His scan did show some constipation which could be contributing to his discomfort, discussed these findings with the patient and recommended MiraLAX bowel regimen for improvement.  Patient expressed understanding is in agreement with this plan.  Will also refill his lidocaine patches  prescription per his request.  Evaluation and diagnostic testing in the emergency department does not suggest an emergent condition requiring admission or immediate intervention beyond what has been performed at this time.  Plan for discharge with close PCP follow-up.  Patient is understanding and amenable with plan, educated on red flag symptoms that would prompt immediate return.  Patient discharged in stable condition.  Final Clinical Impression(s) / ED Diagnoses Final diagnoses:  Right flank pain    Rx / DC Orders ED Discharge Orders          Ordered    lidocaine (LIDODERM) 5 %  Every 24 hours        04/08/23 1928          An After Visit Summary was printed and given to the patient.     Silva Bandy, PA-C 04/08/23 2221    Rozelle Logan, DO 04/09/23 1644

## 2023-04-08 NOTE — ED Notes (Signed)
Pt and visitor have asked multiple times if they can leave- explained that we are still awaiting CT results. During shift change giving report to oncoming RN, pt came up again and asked when he can go- then made threatening comments implying that nursing staff needs to "watch yourself".

## 2023-04-08 NOTE — ED Notes (Signed)
When walking out of the emergency department immediately following being discharged to home, pt wife became very verbally aggressive to primary RN Harlow Mares, RN, pt and wife instructed by primary nurse to be safe and have a good night. Wife then became increasingly aggressive and charged toward myself to continue her verbal aggression stating "Oh I've already recorded you, you little BITCH!" Pt wife standing directly against the side of my body, to which I felt severely uncomfortable, I stepped back raised my hand and asked the pt wife politely to please step back from my personal space. Pt continued walking away insisting to his wife to "come on let's just go, we've already complained about her!" As pt wife walking away she shouts back "and you, you FATASS! Don't you talk to me!" Pt and wife requested to leave the department immediately as they had both myself and Harlow Mares, RN in fear of our safety. Security was requested by radio to the front of the ER at this time for assistance.

## 2023-04-08 NOTE — ED Notes (Signed)
As this RN went in to discharge patient, can hear patient talking out loud about how "I ain't coming back here, I been here 7 hours and they have not done nothing for me... everyone is just rude and dont' give a damn, I shouldn't half to keep asking when I can leave"  This RN step in room and went in to review discharge instruction with pt and wife while at bedside. Pt never looked up at this RN, pt kept messing with cell phone. When asked if they have any questions neither responded, only asked what this RN name was, name was given per request. RN then step out the room after handing pt his discharge papers.   As this RN was charting out the pt from the system at the nurses station, pt's wife came up behind this RN unexpectedly and pointed at this RN and stated is this the nurse (the wife was present when this RN discharge pt in the treatment area and was calm, did not say anything). Wife became very verbally aggressive unsure why wife is upset as this RN has not had any encounters with pt or wife prior to discharge other then pt came out of room during shift change while report was being given and pt was irritable with primary RN Hyman Bower, pt then made a threaten remark towards nursing staff standing at the nurses station, this RN made no remark or comment towards pt, primary RN Hyman Bower explained to pt that they were still waiting on CT results, pt stomp off speaking disrespectfully, using foul language, and threatening verbally.    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 Wife then became psychically and verbally threatening to Trinna Post, Charity fundraiser. This RN asked pt and wife to please leave or security will be called.  Wife became more irritable and cussing at [place], [place]. Pt grab wife and told her to "come on, we got what we need". Alex called for security on radio. Pt and wife left nursing area.

## 2023-05-16 ENCOUNTER — Encounter: Payer: Self-pay | Admitting: *Deleted

## 2023-05-16 ENCOUNTER — Ambulatory Visit
Admission: EM | Admit: 2023-05-16 | Discharge: 2023-05-16 | Disposition: A | Discharge status: Home / Self Care | Payer: Medicare HMO | Attending: Internal Medicine | Admitting: Internal Medicine

## 2023-05-16 ENCOUNTER — Other Ambulatory Visit: Payer: Self-pay

## 2023-05-16 ENCOUNTER — Ambulatory Visit: Payer: No Typology Code available for payment source

## 2023-05-16 DIAGNOSIS — J069 Acute upper respiratory infection, unspecified: Secondary | ICD-10-CM | POA: Diagnosis not present

## 2023-05-16 DIAGNOSIS — R059 Cough, unspecified: Secondary | ICD-10-CM | POA: Diagnosis not present

## 2023-05-16 DIAGNOSIS — R053 Chronic cough: Secondary | ICD-10-CM

## 2023-05-16 MED ORDER — ALBUTEROL SULFATE (2.5 MG/3ML) 0.083% IN NEBU
2.5000 mg | INHALATION_SOLUTION | Freq: Once | RESPIRATORY_TRACT | Status: AC
  Administered 2023-05-16: 2.5 mg via RESPIRATORY_TRACT

## 2023-05-16 MED ORDER — BENZONATATE 100 MG PO CAPS: 100.0000 mg | ORAL_CAPSULE | Freq: Three times a day (TID) | ORAL | 0 refills | Status: DC | PRN

## 2023-05-16 MED ORDER — DOXYCYCLINE HYCLATE 100 MG PO CAPS: 100.0000 mg | ORAL_CAPSULE | Freq: Two times a day (BID) | ORAL | 0 refills | Status: AC

## 2023-05-16 NOTE — ED Triage Notes (Signed)
Productive cough x 2 weeks with wheezing- States feels SOB now. Cough worse at night. Using albuterol inhaler with some relief. Voices concern for pneumonia

## 2023-05-16 NOTE — Discharge Instructions (Signed)
I will call if x-ray is abnormal.  Antibiotic and cough medication prescribed.
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
## 2023-10-05 ENCOUNTER — Telehealth: Payer: Self-pay | Admitting: *Deleted

## 2023-10-05 NOTE — Telephone Encounter (Signed)
   Pre-operative Risk Assessment    Patient Name: Trevor Jackson  DOB: 06-Mar-1961 MRN: [id number]   Date of last office visit: 11/23/13 DR. BERRY Date of next office visit: NONE-PT WILL NEED A NEW PT APPT ; WILL SEND TO OUR SCHEDULING TEAM TO REACH OUT TO THE PT WITH A NEW PT APPT.   Request for Surgical Clearance    Procedure:   COLONOSCOPY  Date of Surgery:  Clearance 11/01/23                                Surgeon:  DR. MANN Surgeon's Group or Practice Name:  [facility] Phone number:  [phone] Fax number:  [phone]   Type of Clearance Requested:   - Medical ; NONE INDICATED TO BE HELD   Type of Anesthesia:   PROPOFOL   Additional requests/questions:    Elpidio Anis   10/05/2023, 12:21 PM

## 2023-10-07 NOTE — Telephone Encounter (Signed)
 Pt has appt 10/13/23 with Dr. Odis Hollingshead as new pt appt for preop clearance. I will update all parties involved.

## 2023-10-07 NOTE — Telephone Encounter (Signed)
 Message sent to our scheduling team to reach out to the pt with a new pt appt for preop.

## 2023-10-13 ENCOUNTER — Ambulatory Visit: Attending: Cardiology | Admitting: Cardiology

## 2023-10-13 ENCOUNTER — Encounter: Payer: Self-pay | Admitting: Cardiology

## 2023-10-13 VITALS — BP 125/73 | HR 64 | Resp 16 | Ht 65.0 in | Wt 254.0 lb

## 2023-10-13 DIAGNOSIS — Z01810 Encounter for preprocedural cardiovascular examination: Secondary | ICD-10-CM | POA: Diagnosis not present

## 2023-10-13 DIAGNOSIS — Z951 Presence of aortocoronary bypass graft: Secondary | ICD-10-CM

## 2023-10-13 DIAGNOSIS — I2510 Atherosclerotic heart disease of native coronary artery without angina pectoris: Secondary | ICD-10-CM

## 2023-10-13 DIAGNOSIS — I252 Old myocardial infarction: Secondary | ICD-10-CM | POA: Diagnosis not present

## 2023-10-13 DIAGNOSIS — E782 Mixed hyperlipidemia: Secondary | ICD-10-CM

## 2023-10-13 DIAGNOSIS — E1159 Type 2 diabetes mellitus with other circulatory complications: Secondary | ICD-10-CM

## 2023-10-13 DIAGNOSIS — Z794 Long term (current) use of insulin: Secondary | ICD-10-CM

## 2023-10-13 DIAGNOSIS — I10 Essential (primary) hypertension: Secondary | ICD-10-CM

## 2023-10-13 DIAGNOSIS — G4733 Obstructive sleep apnea (adult) (pediatric): Secondary | ICD-10-CM

## 2023-10-13 MED ORDER — ASPIRIN 81 MG PO TBEC: 81.0000 mg | DELAYED_RELEASE_TABLET | Freq: Every day | ORAL | Status: AC

## 2023-10-13 NOTE — Patient Instructions (Signed)
 Medication Instructions:  Your physician has recommended you make the following change in your medication:   DECREASE Aspirin to 81 mg once daily    *If you need a refill on your cardiac medications before your next appointment, please call your pharmacy*  Lab Work: None ordered today. If you have labs (blood work) drawn today and your tests are completely normal, you will receive your results only by: MyChart Message (if you have MyChart) OR A paper copy in the mail If you have any lab test that is abnormal or we need to change your treatment, we will call you to review the results.  Testing/Procedures: Your physician has requested that you have an echocardiogram. Echocardiography is a painless test that uses sound waves to create images of your heart. It provides your doctor with information about the size and shape of your heart and how well your heart's chambers and valves are working. This procedure takes approximately one hour. There are no restrictions for this procedure. Please do NOT wear cologne, perfume, aftershave, or lotions (deodorant is allowed). Please arrive 15 minutes prior to your appointment time.  Please note: We ask at that you not bring children with you during ultrasound (echo/ vascular) testing. Due to room size and safety concerns, children are not allowed in the ultrasound rooms during exams. Our front office staff cannot provide observation of children in our lobby area while testing is being conducted. An adult accompanying a patient to their appointment will only be allowed in the ultrasound room at the discretion of the ultrasound technician under special circumstances. We apologize for any inconvenience.   Follow-Up: At [facility], you and your health needs are our priority.  As part of our continuing mission to provide you with exceptional heart care, we have created designated Provider Care Teams.  These Care Teams include your primary Cardiologist  (physician) and Advanced Practice Providers (APPs -  Physician Assistants and Nurse Practitioners) who all work together to provide you with the care you need, when you need it.  Your next appointment:   6 month(s)  The format for your next appointment:   In Person  Provider:   Olinda Bertrand, [facility]  Other Instructions You have been referred to PharmD to meet with one of our pharmacists to initiate Ozempic.   [facility] your last cardi[facility] office visit, last echocardiogram results, and last stress test results to our office. They can fax them to [phone].    1st Floor: - Lobby - Registration  - Pharmacy  - Lab - Cafe  2nd Floor: - PV Lab - Diagnostic Testing (echo, CT, nuclear med)  3rd Floor: - Vacant  4th Floor: - TCTS (cardiothoracic surgery) - AFib Clinic - Structural Heart Clinic - Vascular Surgery  - Vascular Ultrasound  5th Floor: - HeartCare Cardiology (general and EP) - Clinical Pharmacy for coumadin, hypertension, lipid, weight-loss medications, and med management appointments    Valet parking services will be available as well.

## 2023-10-13 NOTE — Progress Notes (Signed)
 Cardiology Office Note:    NAME:  Trevor Jackson    MRN: [id number] DOB:  10/07/1960   PCP:  Rella Cardinal, FNP  Former Cardiology Providers: [facility], [facility] Primary Cardiologist:  Olinda Bertrand, DO, [facility] (established care 10/13/2023) Electrophysiologist:  None   Referring MD: Rella Cardinal  Reason of Consult: CAD  Chief Complaint  Patient presents with   Pre-op Exam   New Patient (Initial Visit)    CAD & establish care    History of Present Illness:    Trevor Jackson is a 63 y.o. African-American male whose past medical history and cardiovascular risk factors includes: premature CAD status post RCA stenting in the past in the setting of an inferior ST-segment elevation myocardial infarction, s/p 3v CABG (LIMA to LAD, free radial OM, SVG to PDA by Dr. Alva Jewels 04/2012), HTN, HLD, IDDM type II, family history  . He is being seen today for the evaluation of establish care, CAD, preop at the request of Rella Cardinal,*.  Patient has a history of premature CAD, ACS, coronary interventions, eventually undergoing surgical revascularization.  He is currently being followed by his cardiologist at the [facility] and patient is here to reestablish care and also preoperative risk assessment prior to undergoing colonoscopy.  He denies anginal chest pain or heart failure symptoms. Overall functional capacity remains relatively stable, enjoys walking 2.1 mph on a treadmill every day for an hour.  The patient does have a history of sleep apnea but chooses not to use his CPAP.  Patient states that he had an echo at the [place] approximately 6 months ago as well as a stress test around that time and to the best of his knowledge were within acceptable limits.  Patient will have his cholesterol rechecked with his PCP next week.  Current Medications: Current Meds  Medication Sig   ACCU-CHEK AVIVA PLUS test strip    Accu-Chek Softclix Lancets lancets     albuterol  (VENTOLIN  HFA) 108 (90 BASE) MCG/ACT inhaler Inhale 2 puffs into the lungs every 6 (six) hours as needed. For shortness of breath   Alcohol Swabs (B-D SINGLE USE SWABS REGULAR) PADS    amLODipine  (NORVASC ) 5 MG tablet Take 1 tablet by mouth 2 (two) times daily.   aspirin  EC 81 MG tablet Take 1 tablet (81 mg total) by mouth daily. Swallow whole.   atorvastatin  (LIPITOR) 80 MG tablet Take 80 mg by mouth daily.   azelastine  (ASTELIN ) 0.1 % nasal spray Place 1 spray into both nostrils 2 (two) times daily. Use in each nostril as directed   carvedilol (COREG) 25 MG tablet Take 0.5 tablets by mouth 2 (two) times daily.   Cholecalciferol 50 MCG (2000 UT) TABS Take 1 tablet by mouth every morning.   fluticasone  (FLONASE ) 50 MCG/ACT nasal spray Place 1-2 sprays into both nostrils daily.   furosemide  (LASIX ) 40 MG tablet take 1 tablet by mouth once daily   HYDROcodone -acetaminophen  (NORCO/VICODIN) 5-325 MG per tablet Take 1 tablet by mouth every 6 (six) hours as needed for moderate pain or severe pain.   indomethacin (INDOCIN) 25 MG capsule    insulin  glargine (LANTUS ) 100 UNIT/ML injection Inject 50 Units into the skin daily.   isosorbide  mononitrate (IMDUR ) 30 MG 24 hr tablet Take 30 mg by mouth daily.   lidocaine  (LIDODERM ) 5 % Place 1 patch onto the skin daily. Remove & Discard patch within 12 hours or as directed by MD   losartan  (COZAAR ) 100 MG tablet  Take 1 tablet (100 mg total) by mouth daily.   meclizine  (ANTIVERT ) 12.5 MG tablet Take 1-2 tablets (12.5-25 mg total) by mouth 3 (three) times daily as needed for dizziness or nausea.   metFORMIN  (GLUCOPHAGE ) 1000 MG tablet Take 1,000 mg by mouth 2 (two) times daily with a meal.   Niacin  CR 1000 MG TBCR    vitamin C (ASCORBIC ACID) 500 MG tablet Take 500 mg by mouth daily.   zolpidem  (AMBIEN ) 10 MG tablet    [DISCONTINUED] aspirin  EC 325 MG tablet Take 325 mg by mouth daily.     Allergies:    Patient has no known allergies.   Past  Medical History: Past Medical History:  Diagnosis Date   Anxiety    Anxiety and depression 05/18/2012   Benign head tremor    CHF (congestive heart failure) (HCC)    Coronary artery disease 05/18/2012   Diabetes mellitus    Headache(784.0) 05/18/2012   Hyperlipidemia    Hypertension    dr berry   Morbid obesity with BMI of 40.0-44.9, adult (HCC) 05/18/2012   Myocardial infarction (HCC) 01/07/2011   Obstructive sleep apnea 05/18/2012   On CPAP   OSA (obstructive sleep apnea)    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   LIMA to LAD, LRA to OM, SVG to PDA, EVH from right thigh    Past Surgical History: Past Surgical History:  Procedure Laterality Date   CORONARY ANGIOPLASTY WITH STENT PLACEMENT  01-07-11   CORONARY ARTERY BYPASS GRAFT  06/08/2012   Procedure: CORONARY ARTERY BYPASS GRAFTING (CABG);  Surgeon: Gardenia Jump, MD;  Location: [facility] OR;  Service: Open Heart Surgery;  Laterality: N/A;   kidney stone removal     LEFT HEART CATHETERIZATION WITH CORONARY ANGIOGRAM N/A 05/13/2012   Procedure: LEFT HEART CATHETERIZATION WITH CORONARY ANGIOGRAM;  Surgeon: Arleen Lacer, MD;  Location: [facility] CATH LAB;  Service: Cardiovascular;  Laterality: N/A;   RADIAL ARTERY HARVEST  06/08/2012   Procedure: RADIAL ARTERY HARVEST;  Surgeon: Gardenia Jump, MD;  Location: [facility] OR;  Service: Open Heart Surgery;  Laterality: Left;   SHOULDER SURGERY  2008    Social History: Social History   Tobacco Use   Smoking status: Never   Smokeless tobacco: Never  Vaping Use   Vaping status: Never Used  Substance Use Topics   Alcohol use: No   Drug use: No    Family History: Family History  Problem Relation Age of Onset   Heart disease Mother    Hyperlipidemia Mother    Hypertension Mother    Heart attack Mother    Heart disease Sister    Bone cancer Father    Heart attack Father    Diabetes Sister    Hyperlipidemia Sister    Hypertension Sister    Prostate cancer Brother    Heart attack Brother     Hyperlipidemia Brother    Hypertension Brother     ROS:   Review of Systems  Cardiovascular:  Negative for chest pain, claudication, irregular heartbeat, leg swelling, near-syncope, orthopnea, palpitations, paroxysmal nocturnal dyspnea and syncope.  Respiratory:  Negative for shortness of breath.   Hematologic/Lymphatic: Negative for bleeding problem.    EKGs/Labs/Other Studies Reviewed:   EKG: EKG Interpretation Date/Time:  Wednesday October 13 2023 14:04:38 EDT Ventricular Rate:  63 PR Interval:  158 QRS Duration:  92 QT Interval:  392 QTC Calculation: 401 R Axis:   65  Text Interpretation: Normal sinus rhythm Nonspecific T wave abnormality When compared  with ECG of 09-Jun-2012 07:12, Vent. rate has decreased BY  31 BPM Nonspecific T wave abnormality now evident in Anterior leads Confirmed by Olinda Bertrand [phone]) on 10/13/2023 2:48:26 PM  [facility].  St[facility]  [facility]  Heart Catheterization: 04/2012: Left Ventriculography: EF: 55%  Wall Motion: mild basal inferior hypokinesis.   Coronary Anatomy: Left Main: Large caliber vessel that bifurcates into LAD & Circumflex. LAD: Moderate to large caliber vessel with ~40% proximal stenosis, most notable lesion is at the D2 branch point ~70% focal lesion. D1: Small caliber vessel, diffuse luminal irregularities D2: small to moderate caliber vessel with ostial ~60% stenosis as part of the bifurcation lesion. Left Circumflex: Large caliber vessel that gives off an early (insiginificant) OM followed by ~80% stenosis just before bifurcating to AVGroove Circumflex & Lateral OM@. OM 2: Moderate to large caliber vessel, proximal 50-60% stenosis AVGroove Circumflex  Is a moderate caliber vessel that gives off a small LPL branch before terminating as an LPL branch in the posterior AV Groove. RCA: Large caliber vessel with stent in the mid vessel, ~60% hazy ISR, diffuse luminl  irregularities with ~40-50% distal stenosis prior to bifurcation. RPDA: Small to moderate caliber vessel with proximal ~80% stenosis. RPL Sysytem:The Right Posterior AV Groove Branch begins as a small to moderate   Labs:    Latest Ref Rng & Units 04/08/2023    1:41 PM 06/11/2012    7:00 AM 06/10/2012    4:05 AM  CBC  WBC 4.0 - 10.5 K/uL 7.7  9.8  16.2   Hemoglobin 13.0 - 17.0 g/dL 19.1  9.2  9.6   Hematocrit 39.0 - 52.0 % 43.2  28.0  28.5   Platelets 150 - 400 K/uL 256  130  123        Latest Ref Rng & Units 04/08/2023    1:41 PM 06/11/2012    7:00 AM 06/10/2012    4:05 AM  BMP  Glucose 70 - 99 mg/dL 478  295  99   BUN 8 - 23 mg/dL 17  14  14    Creatinine 0.61 - 1.24 mg/dL 6.21  3.08  6.57   Sodium 135 - 145 mmol/L 139  136  137   Potassium 3.5 - 5.1 mmol/L 4.0  4.3  3.8   Chloride 98 - 111 mmol/L 103  96  99   CO2 22 - 32 mmol/L 28  29  29    Calcium  8.9 - 10.3 mg/dL 9.2  8.5  8.3       Latest Ref Rng & Units 04/08/2023    1:41 PM 06/11/2012    7:00 AM 06/10/2012    4:05 AM  CMP  Glucose 70 - 99 mg/dL 846  962  99   BUN 8 - 23 mg/dL 17  14  14    Creatinine 0.61 - 1.24 mg/dL 9.52  8.41  3.24   Sodium 135 - 145 mmol/L 139  136  137   Potassium 3.5 - 5.1 mmol/L 4.0  4.3  3.8   Chloride 98 - 111 mmol/L 103  96  99   CO2 22 - 32 mmol/L 28  29  29    Calcium  8.9 - 10.3 mg/dL 9.2  8.5  8.3    External Labs: Collected: May 27, 2023 provided by the patient. BUN 12, creatinine 0.83 eGFR 99. Sodium 143, potassium 0.9, chloride 99, bicarb 23. Albumin  4.5. AST, ALT, alkaline phosphatase within normal limits.  A1c 6.2. Hemoglobin 13.9  Physical Exam:    Today's Vitals   10/13/23 1401  BP: 125/73  Pulse: 64  Resp: 16  SpO2: 94%  Weight: 254 lb (115.2 kg)  Height: 5\' 5"  (1.651 m)   Body mass index is 42.27 kg/m. Wt Readings from Last 3 Encounters:  10/13/23 254 lb (115.2 kg)  06/23/17 257 lb (116.6 kg)  06/11/17 253 lb 9.6 oz (115 kg)    Physical  Exam   Impression & Recommendation(s):  Impression:   ICD-10-CM   1. Coronary artery disease involving native coronary artery of native heart without angina pectoris  I25.10 aspirin  EC 81 MG tablet    2. History of ST elevation myocardial infarction (STEMI)  I25.2     3. Hx of CABG  Z95.1     4. Preprocedural cardiovascular examination  Z01.810 EKG 12-Lead    ECHOCARDIOGRAM COMPLETE    5. Benign hypertension  I10     6. Mixed hyperlipidemia  E78.2 AMB Referral to [facility] Pharm-D    7. Obstructive sleep apnea  G47.33     8. Type 2 diabetes mellitus with other circulatory complication, with long-term current use of insulin  (HCC)  E11.59    Z79.4        Recommendation(s):  Coronary artery disease involving native coronary artery of native heart without angina pectoris History of ST elevation myocardial infarction (STEMI) Hx of CABG Chronic and stable. Denies anginal chest pain. Wishes to transition his cardiovascular care from the [facility] to [place]. Will reach out to his former cardiologist obtain the most recent echo and stress test results for reference. Currently takes aspirin  325 mg p.o. daily, transition to 81 mg p.o. daily. Hold aspirin  7 days prior to his colonoscopy. Will have his lipids checked with PCP on 11/05/2023-will send us  a copy for reference Reemphasized importance of using his CPAP as his sleep apnea is closely linked with cardiovascular care and outcomes Will refer to Pharm.D. for discussing initiation of Ozempic given his diabetes and CAD and obesity On optimal medical therapy: Continue Lasix  40 mg p.o. daily, Cozaar  100 mg p.o. daily, amlodipine  5 mg p.o. daily, carvedilol 12.5 mg p.o. twice daily, isosorbide  30 mg p.o. daily  Preprocedural cardiovascular examination According to the Revised Cardiac Risk Index (RCRI), his Perioperative Risk of Major Cardiac Event is (%): 6.6 His Functional Capacity in METs is: 7.25 according to the [facility] Activity  Status Index (DASI). Overall optimized from a cardiovascular standpoint Denies anginal chest pain or heart failure symptoms Given his complex cardiovascular history at a relatively young age would like to at least get an echocardiogram at baseline to evaluate his LVEF. Will request his prior records from [facility] Further recommendations to follow  Benign hypertension Office blood pressures are well-controlled. Medications as discussed above. Reemphasized importance of low-salt diet.  Mixed hyperlipidemia Continue atorvastatin  80 mg p.o. nightly. Will have fasting lipids with PCP.   Obstructive sleep apnea Reemphasized importance of improving compliance with his CPAP  Type 2 diabetes mellitus with other circulatory complication, with long-term current use of insulin  (HCC) Currently on insulin , ARB, statin therapy Referring him to Pharm.D. for initiation of Ozempic.   Orders Placed:  Orders Placed This Encounter  Procedures   AMB Referral to [facility] Pharm-D    Referral Priority:   Routine    Referral Type:   Consultation    Referral Reason:   Specialty Services Required    Number of Visits Requested:   1   EKG  12-Lead   ECHOCARDIOGRAM COMPLETE    This is for pre-op clearance for a procedure on 5/5. Please schedule ASAP.    Standing Status:   Future    Expected Date:   10/20/2023    Expiration Date:   10/12/2024    Scheduling Instructions:     This is for pre-op clearance for a procedure on 5/5. Please schedule ASAP.    Where should this test be performed:   [facility]    Does the patient weigh less than or greater than 250 lbs?:   Patient weighs greater than 250 lbs             254 lbs    Perflutren DEFINITY (image enhancing agent) should be administered unless hypersensitivity or allergy exist:   Administer Perflutren    Reason for exam-Echo:   Pre-operative cardiovascular examination Z01.810    Other Comments:   This is for pre-op clearance  for a procedure on 5/5. Please schedule ASAP.     Final Medication List:    Meds ordered this encounter  Medications   aspirin  EC 81 MG tablet    Sig: Take 1 tablet (81 mg total) by mouth daily. Swallow whole.    Medications Discontinued During This Encounter  Medication Reason   benzonatate  (TESSALON ) 100 MG capsule Completed Course   HYDROcodone -acetaminophen  (NORCO) 5-325 MG tablet Duplicate   loratadine  (CLARITIN ) 10 MG tablet Completed Course   oxyCODONE -acetaminophen  (PERCOCET) 7.5-325 MG tablet Change in therapy   insulin  detemir (LEVEMIR) 100 UNIT/ML injection Change in therapy   aspirin  EC 325 MG tablet Dose change     Current Outpatient Medications:    ACCU-CHEK AVIVA PLUS test strip, , Disp: , Rfl:    Accu-Chek Softclix Lancets lancets, , Disp: , Rfl:    albuterol  (VENTOLIN  HFA) 108 (90 BASE) MCG/ACT inhaler, Inhale 2 puffs into the lungs every 6 (six) hours as needed. For shortness of breath, Disp: , Rfl:    Alcohol Swabs (B-D SINGLE USE SWABS REGULAR) PADS, , Disp: , Rfl:    amLODipine  (NORVASC ) 5 MG tablet, Take 1 tablet by mouth 2 (two) times daily., Disp: , Rfl:    aspirin  EC 81 MG tablet, Take 1 tablet (81 mg total) by mouth daily. Swallow whole., Disp: , Rfl:    atorvastatin  (LIPITOR) 80 MG tablet, Take 80 mg by mouth daily., Disp: , Rfl:    azelastine  (ASTELIN ) 0.1 % nasal spray, Place 1 spray into both nostrils 2 (two) times daily. Use in each nostril as directed, Disp: 30 mL, Rfl: 0   carvedilol (COREG) 25 MG tablet, Take 0.5 tablets by mouth 2 (two) times daily., Disp: , Rfl:    Cholecalciferol 50 MCG (2000 UT) TABS, Take 1 tablet by mouth every morning., Disp: , Rfl:    fluticasone  (FLONASE ) 50 MCG/ACT nasal spray, Place 1-2 sprays into both nostrils daily., Disp: 16 g, Rfl: 0   furosemide  (LASIX ) 40 MG tablet, take 1 tablet by mouth once daily, Disp: 30 tablet, Rfl: 4   HYDROcodone -acetaminophen  (NORCO/VICODIN) 5-325 MG per tablet, Take 1 tablet by mouth  every 6 (six) hours as needed for moderate pain or severe pain., Disp: 10 tablet, Rfl: 0   indomethacin (INDOCIN) 25 MG capsule, , Disp: , Rfl:    insulin  glargine (LANTUS ) 100 UNIT/ML injection, Inject 50 Units into the skin daily., Disp: , Rfl:    isosorbide  mononitrate (IMDUR ) 30 MG 24 hr tablet, Take 30 mg by mouth daily., Disp: , Rfl:  lidocaine  (LIDODERM ) 5 %, Place 1 patch onto the skin daily. Remove & Discard patch within 12 hours or as directed by MD, Disp: 30 patch, Rfl: 0   losartan  (COZAAR ) 100 MG tablet, Take 1 tablet (100 mg total) by mouth daily., Disp: 30 tablet, Rfl: 1   meclizine  (ANTIVERT ) 12.5 MG tablet, Take 1-2 tablets (12.5-25 mg total) by mouth 3 (three) times daily as needed for dizziness or nausea., Disp: 30 tablet, Rfl: 0   metFORMIN  (GLUCOPHAGE ) 1000 MG tablet, Take 1,000 mg by mouth 2 (two) times daily with a meal., Disp: , Rfl:    Niacin  CR 1000 MG TBCR, , Disp: , Rfl:    vitamin C (ASCORBIC ACID) 500 MG tablet, Take 500 mg by mouth daily., Disp: , Rfl:    zolpidem  (AMBIEN ) 10 MG tablet, , Disp: , Rfl:   Consent:   N/A  Disposition:   6 months sooner if needed Patient may be asked to follow-up sooner based on the results of the above-mentioned testing.  His questions and concerns were addressed to his satisfaction. He voices understanding of the recommendations provided during this encounter.    Signed, Olinda Bertrand, DO, [facility] [place]  [facility]  [address] [place], [place] [address]

## 2023-10-14 ENCOUNTER — Telehealth: Payer: Self-pay | Admitting: Cardiology

## 2023-10-14 NOTE — Telephone Encounter (Signed)
 Pt calling tin regards to provider requesting office visit note and results from previous [place] Cardiologist. Pt states that per previous Gen Card we need to fax a request for those documents to the number below. Please advise  Attn: Avis Boehringer [phone]

## 2023-10-19 NOTE — Telephone Encounter (Signed)
 Spoke with pt and explained he will need to sign the release of information form for us  to fax to the [place]. Pt stated he will stop by the office tomorrow (4/23) to sign the form. [facility] at [facility] desk.

## 2023-11-17 ENCOUNTER — Telehealth: Payer: Self-pay

## 2023-11-17 NOTE — Telephone Encounter (Signed)
   Pre-operative Risk Assessment    Patient Name: Trevor Jackson  DOB: 07/18/1960 MRN: [id number]   Date of last office visit: 10/13/23 with Albert Huff Date of next office visit: None   Request for Surgical Clearance    Procedure:  Colonoscopy  Date of Surgery:  Clearance 11/29/23                                Surgeon:  Dr. Cheral Cordoba Group or Practice Name:  [facility], [place] Phone number:  [phone] Fax number:  [phone]   Type of Clearance Requested:   - Medical  - Pharmacy:  Hold Aspirin  not indicated   Type of Anesthesia:  Propofol    Additional requests/questions:    Kenny Peals   11/17/2023, 2:18 PM

## 2023-11-17 NOTE — Telephone Encounter (Signed)
   Patient Name: Trevor Jackson  DOB: 04-May-1961 MRN: [id number]  Primary Cardiologist: Olinda Bertrand, DO  Chart reviewed as part of pre-operative protocol coverage. Given past medical history and time since last visit, based on ACC/AHA guidelines, Acy Orsak is at acceptable risk for the planned procedure without further cardiovascular testing.   The patient was advised that if he develops new symptoms prior to surgery to contact our office to arrange for a follow-up visit, and he verbalized understanding.  Per office protocol, if patient is without any new symptoms or concerns at the time of their virtual visit, he/she may hold ASA for 7 days prior to procedure. Please resume ASA as soon as possible postprocedure, at the discretion of the surgeon.    I will route this recommendation to the requesting party via Epic fax function and remove from pre-op pool.  Please call with questions.  Friddie Jetty, NP 11/17/2023, 2:30 PM

## 2023-11-19 ENCOUNTER — Ambulatory Visit (HOSPITAL_COMMUNITY)
Admission: RE | Admit: 2023-11-19 | Discharge: 2023-11-19 | Disposition: A | Discharge status: Home / Self Care | Source: Ambulatory Visit | Attending: Cardiology | Admitting: Cardiology

## 2023-11-19 DIAGNOSIS — Z01810 Encounter for preprocedural cardiovascular examination: Secondary | ICD-10-CM | POA: Diagnosis present

## 2023-11-19 LAB — ECHOCARDIOGRAM COMPLETE
Area-P 1/2: 3.17 cm2
S' Lateral: 3.2 cm

## 2023-11-21 ENCOUNTER — Ambulatory Visit: Payer: Self-pay | Admitting: Cardiology

## 2023-11-23 ENCOUNTER — Telehealth: Payer: Self-pay | Admitting: Cardiology

## 2023-11-23 NOTE — Telephone Encounter (Signed)
-----   Message from [facility] sent at 11/21/2023  7:26 PM EDT ----- Regarding: Preprocedure letter When last seen in the office he was requesting a preop letter for colonoscopy.  Recommended to proceed forward with an echocardiogram prior to providing risk stratification.  His echo was just done last week but his colonoscopy was scheduled for May 5.  Could you please find out if he still needs a letter for the colonoscopy or was already completed  [place], DO, FACC

## 2023-11-23 NOTE — Telephone Encounter (Signed)
 Spoke with pt over the phone and he stated he talked with the office who is doing his colonoscopy and they were given the okay to move forward from cardiology already and do not need anything else from our office. Pt stated he also doesn't need anything else and would let us  know if there was anything in the future.

## 2023-11-23 NOTE — Telephone Encounter (Signed)
 Pt would like a c/b to go over Echo results. Please advise

## 2023-12-03 ENCOUNTER — Telehealth: Payer: Self-pay | Admitting: Pharmacist

## 2023-12-03 ENCOUNTER — Ambulatory Visit: Attending: Cardiology | Admitting: Pharmacist

## 2023-12-03 ENCOUNTER — Telehealth: Payer: Self-pay | Admitting: Pharmacy Technician

## 2023-12-03 ENCOUNTER — Other Ambulatory Visit (HOSPITAL_COMMUNITY): Payer: Self-pay

## 2023-12-03 ENCOUNTER — Encounter: Payer: Self-pay | Admitting: Pharmacist

## 2023-12-03 VITALS — Ht 65.0 in | Wt 250.0 lb

## 2023-12-03 DIAGNOSIS — E782 Mixed hyperlipidemia: Secondary | ICD-10-CM

## 2023-12-03 DIAGNOSIS — Z6841 Body Mass Index (BMI) 40.0 and over, adult: Secondary | ICD-10-CM

## 2023-12-03 MED ORDER — MOUNJARO 2.5 MG/0.5ML ~~LOC~~ SOAJ
2.5000 mg | SUBCUTANEOUS | 0 refills | Status: DC
  Filled 2023-12-03: qty 2, 28d supply, fill #0

## 2023-12-03 MED ORDER — EZETIMIBE 10 MG PO TABS
10.0000 mg | ORAL_TABLET | Freq: Every day | ORAL | 3 refills | Status: AC
  Filled 2023-12-03: qty 90, 90d supply, fill #0

## 2023-12-03 NOTE — Telephone Encounter (Signed)
See other encounter for more info.

## 2023-12-03 NOTE — Progress Notes (Signed)
 Patient ID: Trevor Jackson                 DOB: 01/18/1961                    MRN: [id number]      HPI: Trevor Jackson is a 63 y.o. male patient referred to lipid clinic by Dr. Albert Huff. PMH is significant for HTN, CAD, s/p CABG X311/2013, obesity, OSA, T2DM  Last A1c 6 10/2023 Baseline weight and BMI: 254 lbs 42.27 kg/m  Current weight and BMI: 250 lbs  kg/m  Current meds that affect weight: Lantus  Goal weight - 200 lbs  Current DM medications - Lantus  50 units at night, metformin  1 gm twice daily    Patient presented today for lipid and GLP1 consultation. Patient has insurance through [place] and he has his own insurance. He prefer to get meds from his private insurance over [place]. He has been following healthy diet and try to walk everyday.  He has lost 8 lbs in last few week. Reports his appetite is lot smaller then before if he eats breakfast he skips lunch as he is not hungry. His BG is well controlled on basal insulin  with metformin  FBG 88 Post meal BG 130 and A1c 6- 10/2023.   Reviewed ezetimibe.  Discussed mechanisms of action, dosing, side effects and potential decreases in LDL cholesterol.   Current Medications: atorvastatin  80 mg daily  Intolerances:  Risk Factors: premature CAD - CABG at age of 12, hypertension, obesity, T2DM LDL goal: <55 mg/dl  Last lab TC 409, TG 811, HDL 50, LDL 59 while on Lipitor 80 mg daily  Diet:  Breakfast: Cereals some days, Eggs,Grits,Sausage Lunch: Tuna sandwich with spinach Dinner:Greens,Chicken (grilled or baked),Fish (grilled or baked) Drinks:,Herbal tea,Water,Soda (1 per week)   Exercise: 30 min walking per day, willing to start resistance exercise   Family History:  Relation Problem Comments  Mother (Deceased) Heart attack   Heart disease   Hyperlipidemia   Hypertension     Father (Deceased) Bone cancer   Heart attack     Sister (Deceased) Heart disease     Sister (Deceased) Diabetes     Sister (Deceased) Hyperlipidemia      Sister Metallurgist) Hypertension     Brother Heart attack   Prostate cancer     Brother Hyperlipidemia     Brother Hypertension       Social History:  Alcohol: 2 drinks per week  Smoking: never  Labs:  Lipid Panel     Component Value Date/Time   CHOL 324 (H) 01/07/2011 1300   TRIG 341 (H) 01/07/2011 1300   HDL 52 01/07/2011 1300   CHOLHDL 6.2 01/07/2011 1300   VLDL 68 (H) 01/07/2011 1300   LDLCALC 204 (H) 01/07/2011 1300    Past Medical History:  Diagnosis Date   Anxiety    Anxiety and depression 05/18/2012   Benign head tremor    CHF (congestive heart failure) (HCC)    Coronary artery disease 05/18/2012   Diabetes mellitus    Headache(784.0) 05/18/2012   Hyperlipidemia    Hypertension    dr berry   Morbid obesity with BMI of 40.0-44.9, adult (HCC) 05/18/2012   Myocardial infarction (HCC) 01/07/2011   Obstructive sleep apnea 05/18/2012   On CPAP   OSA (obstructive sleep apnea)    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 For shortness of breath     Alcohol Swabs (B-D SINGLE USE SWABS REGULAR) PADS      amLODipine  (NORVASC ) 5 MG tablet Take 1 tablet by mouth 2 (two) times daily.     aspirin  EC 81 MG tablet Take 1 tablet (81 mg total) by mouth daily. Swallow whole.     atorvastatin  (LIPITOR) 80 MG tablet Take 80 mg by mouth daily.     azelastine  (ASTELIN ) 0.1 % nasal spray Place 1 spray into both nostrils 2 (two) times daily. Use in each nostril as directed 30 mL 0   carvedilol (COREG) 25 MG tablet Take 0.5 tablets by mouth 2 (two) times daily.     Cholecalciferol 50 MCG (2000 UT) TABS Take 1 tablet by mouth every morning.     fluticasone   (FLONASE ) 50 MCG/ACT nasal spray Place 1-2 sprays into both nostrils daily. 16 g 0   furosemide  (LASIX ) 40 MG tablet take 1 tablet by mouth once daily 30 tablet 4   HYDROcodone -acetaminophen  (NORCO/VICODIN) 5-325 MG per tablet Take 1 tablet by mouth every 6 (six) hours as needed for moderate pain or severe pain. 10 tablet 0   indomethacin (INDOCIN) 25 MG capsule      insulin  glargine (LANTUS ) 100 UNIT/ML injection Inject 50 Units into the skin daily.     isosorbide  mononitrate (IMDUR ) 30 MG 24 hr tablet Take 30 mg by mouth daily.     lidocaine  (LIDODERM ) 5 % Place 1 patch onto the skin daily. Remove & Discard patch within 12 hours or as directed by MD 30 patch 0   losartan  (COZAAR ) 100 MG tablet Take 1 tablet (100 mg total) by mouth daily. 30 tablet 1   meclizine  (ANTIVERT ) 12.5 MG tablet Take 1-2 tablets (12.5-25 mg total) by mouth 3 (three) times daily as needed for dizziness or nausea. 30 tablet 0   metFORMIN  (GLUCOPHAGE ) 1000 MG tablet Take 1,000 mg by mouth 2 (two) times daily with a meal.     Niacin  CR 1000 MG TBCR      vitamin C (ASCORBIC ACID) 500 MG tablet Take 500 mg by mouth daily.     zolpidem  (AMBIEN ) 10 MG tablet      No current facility-administered medications on file prior to visit.    No Known Allergies  Assessment/Plan:  1. Hyperlipidemia -  The patient's most recent lab at the primary care provider shows an LDL cholesterol level of 59 mg/dL, which is slightly above the target goal of less than 55 mg/dL given his risk factors. He tolerates his current statin therapy well, with no reported side effects. To further lower his LDL, ezetimibe (Zetia) 10 mg daily will be added to his existing Lipitor 80 mg daily regimen. Lipid panel and liver function tests (LFTs) will be rechecked in 2 to 3 months to monitor response and safety.  2. Weight loss/DM  -  patient's diabetes is well controlled on his current regimen of basal insulin  50 units at night and metformin  2 grams per day.  Despite significant lifestyle changes, he has been unable to achieve meaningful weight loss. His most recent A1c is 6.0%. Given his atherosclerotic cardiovascular disease (ASCVD) in the setting of diabetes, he would benefit  from the addition of semaglutide or tirzepatide, as recommended by the updated ADA guidelines. To maximize the weight loss benefits of GLP-1 therapy while optimizing glycemic control, we plan to taper down his basal insulin  dose accordingly.Will start coverage assessment for Mounjaro. Confirmed patient has no  personal or family history of medullary thyroid carcinoma (MTC) or Multiple Endocrine Neoplasia syndrome type 2 (MEN 2). Injection technique reviewed at today's visit.  Advised patient on common side effects including nausea, diarrhea, dyspepsia, decreased appetite, and fatigue. Counseled patient on reducing meal size and how to titrate medication to minimize side effects. Counseled patient to call if intolerable side effects or if experiencing dehydration, abdominal pain, or dizziness. Along with pharmacotherapy patient will adhere to dietary modifications and will target at least 150 minutes of moderate intensity exercise weekly.    Follow up in 1-2 days regarding coverage of Mounjaro. If therapy is initiated, phone follow-ups will be conducted every 4 weeks for dose titration until the patient reaches the effective therapeutic dose and target weight.  Thank you,  Nickola Baron, Pharm.D [place] [facility] Mom. [facility] [address], [place], [place] [address] Phone: [phone]; Fax: [phone]

## 2023-12-03 NOTE — Patient Instructions (Addendum)
 GLP1 Agonist Titration Plan:  Will plan to follow the titration plan as below, pending patient is tolerating each dose before increasing to the next. Can slow titration if needed for tolerability.   Mounjaro   Treatment week   Weeks 1-4   2.5 mg once a week  Weeks 5-8    5 mg once a week   Week 9 -12  7.5 mg once a week   Week 13-16   10 mg once week   Week 17- 20  12.5 mg once week    Maximum dosage 15 mg once a week

## 2023-12-03 NOTE — Assessment & Plan Note (Signed)
 Assessment and Plan:  patient's diabetes is well controlled on his current regimen of basal insulin  50 units at night and metformin  2 grams per day. Despite significant lifestyle changes, he has been unable to achieve meaningful weight loss. His most recent A1c is 6.0%. Given his atherosclerotic cardiovascular disease (ASCVD) in the setting of diabetes, he would benefit from the addition of semaglutide or tirzepatide, as recommended by the updated ADA guidelines. To maximize the weight loss benefits of GLP-1 therapy while optimizing glycemic control, we plan to taper down his basal insulin  dose accordingly.Will start coverage assessment for Mounjaro. Confirmed patient has no  personal or family history of medullary thyroid carcinoma (MTC) or Multiple Endocrine Neoplasia syndrome type 2 (MEN 2). Injection technique reviewed at today's visit.  Advised patient on common side effects including nausea, diarrhea, dyspepsia, decreased appetite, and fatigue. Counseled patient on reducing meal size and how to titrate medication to minimize side effects. Counseled patient to call if intolerable side effects or if experiencing dehydration, abdominal pain, or dizziness. Along with pharmacotherapy patient will adhere to dietary modifications and will target at least 150 minutes of moderate intensity exercise weekly.    Follow up in 1-2 days regarding coverage of Mounjaro. If therapy is initiated, phone follow-ups will be conducted every 4 weeks for dose titration until the patient reaches the effective therapeutic dose and target weight.

## 2023-12-03 NOTE — Telephone Encounter (Signed)
 Pharmacy Patient Advocate Encounter   Received notification from Pt Calls Messages that prior authorization for Mounjaro is required/requested.   Insurance verification completed.   The patient is insured through [employer] .   Per test claim: The current 12/03/23 day co-pay is, $106.02- one month.  No PA needed at this time. This test claim was processed through [facility]- copay amounts may vary at other pharmacies due to pharmacy/plan contracts, or as the patient moves through the different stages of their insurance plan.     PA already on file and valid until 06/28/24

## 2023-12-03 NOTE — Telephone Encounter (Signed)
 Pt was informed about the co-pay. Prescription sent to [facility] pharmacy. Pt was advised to lower Lantus  dose to 45 units per day

## 2023-12-03 NOTE — Assessment & Plan Note (Signed)
 Assessment and Plan:  Patient's most recent lab at the primary care provider shows an LDL cholesterol level of 59 mg/dL, which is slightly above the target goal of less than 55 mg/dL given his risk factors. He tolerates his current statin therapy well, with no reported side effects. To further lower his LDL, ezetimibe (Zetia) 10 mg daily will be added to his existing Lipitor 80 mg daily regimen. Lipid panel and liver function tests (LFTs) will be rechecked in 2 to 3 months to monitor response and safety.

## 2023-12-03 NOTE — Addendum Note (Signed)
 Addended by: Deshanta Lady K on: 12/03/2023 10:21 AM   Modules accepted: Orders

## 2023-12-29 ENCOUNTER — Telehealth: Payer: Self-pay | Admitting: Pharmacist

## 2023-12-29 ENCOUNTER — Other Ambulatory Visit (HOSPITAL_COMMUNITY): Payer: Self-pay

## 2023-12-29 MED ORDER — MOUNJARO 5 MG/0.5ML ~~LOC~~ SOAJ: 5.0000 mg | SUBCUTANEOUS | 0 refills | Status: AC

## 2023-12-29 NOTE — Telephone Encounter (Signed)
 Called patient for Mounjaro  dose titration. The patient has been using Lantus  approximately 46-47 units nightly and is tolerating the Mounjaro  2.5 mg dose well. Fasting blood glucose (FBG) ranges between 111 and 124 mg/dL. The patient reports walking 2 miles most days of the week. Encouraged the patient to incorporate resistance exercises 2-3 times per week, tailored to their ability, to help prevent muscle loss associated with GLP-1 therapy. The patient is ready to advance to the next dose level. A prescription for Mounjaro  5 mg has been sent to the preferred pharmacy. Phone f/u due in 3-4 weeks. Lantus  dose reduced by 20% ( 37-38 units per night)

## 2024-01-24 ENCOUNTER — Telehealth: Payer: Self-pay | Admitting: Pharmacist

## 2024-01-24 NOTE — Telephone Encounter (Signed)
 Call to titrate Moujaro dose, [facility] will be taking [facility] the rest of the titration for this GLP1. Will f/u with them. Pt will call us  if he has any question.

## 2024-02-18 ENCOUNTER — Other Ambulatory Visit: Payer: Self-pay

## 2024-03-03 ENCOUNTER — Telehealth: Payer: Self-pay | Admitting: Pharmacist

## 2024-03-03 NOTE — Telephone Encounter (Signed)
 Call pt to remind about f/u lipid and LFT lab - Zetia  was added to statin 11/2023. [facility] get [facility] done at [facility] so [facility] provide to [facility] next time he sees them.
